# Patient Record
Sex: Female | Born: 1985 | Race: Black or African American | Hispanic: No | Marital: Single | Smoking: Never smoker
Health system: Southern US, Community
[De-identification: ages and names within clinical notes are randomized; demographics above are authoritative.]

## PROBLEM LIST (undated history)

## (undated) DIAGNOSIS — D649 Anemia, unspecified: Secondary | ICD-10-CM

## (undated) DIAGNOSIS — J45909 Unspecified asthma, uncomplicated: Secondary | ICD-10-CM

## (undated) DIAGNOSIS — M419 Scoliosis, unspecified: Secondary | ICD-10-CM

---

## 2004-12-09 ENCOUNTER — Encounter: Admission: RE | Admit: 2004-12-09 | Discharge: 2004-12-09 | Payer: Self-pay | Admitting: Family Medicine

## 2005-02-15 ENCOUNTER — Other Ambulatory Visit: Admission: RE | Admit: 2005-02-15 | Discharge: 2005-02-15 | Payer: Self-pay | Admitting: Family Medicine

## 2005-12-15 ENCOUNTER — Inpatient Hospital Stay (HOSPITAL_COMMUNITY): Admission: RE | Admit: 2005-12-15 | Discharge: 2005-12-18 | Payer: Self-pay | Admitting: Obstetrics

## 2007-04-23 ENCOUNTER — Emergency Department (HOSPITAL_COMMUNITY): Admission: EM | Admit: 2007-04-23 | Discharge: 2007-04-24 | Payer: Self-pay | Admitting: Emergency Medicine

## 2007-06-16 ENCOUNTER — Inpatient Hospital Stay (HOSPITAL_COMMUNITY): Admission: AD | Admit: 2007-06-16 | Discharge: 2007-06-17 | Payer: Self-pay | Admitting: Obstetrics

## 2007-06-17 ENCOUNTER — Encounter (INDEPENDENT_AMBULATORY_CARE_PROVIDER_SITE_OTHER): Payer: Self-pay | Admitting: Obstetrics

## 2008-03-18 ENCOUNTER — Emergency Department (HOSPITAL_COMMUNITY): Admission: EM | Admit: 2008-03-18 | Discharge: 2008-03-18 | Payer: Self-pay | Admitting: Emergency Medicine

## 2009-02-10 ENCOUNTER — Encounter: Admission: RE | Admit: 2009-02-10 | Discharge: 2009-04-06 | Payer: Self-pay | Admitting: Pediatric Orthopedic Surgery
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
## 2009-09-07 ENCOUNTER — Ambulatory Visit (HOSPITAL_COMMUNITY): Admission: RE | Admit: 2009-09-07 | Discharge: 2009-09-07 | Payer: Self-pay | Admitting: Obstetrics

## 2010-01-11 ENCOUNTER — Inpatient Hospital Stay (HOSPITAL_COMMUNITY): Admission: AD | Admit: 2010-01-11 | Discharge: 2010-01-11 | Payer: Self-pay | Admitting: Obstetrics & Gynecology

## 2010-01-18 ENCOUNTER — Inpatient Hospital Stay (HOSPITAL_COMMUNITY): Admission: AD | Admit: 2010-01-18 | Discharge: 2010-01-21 | Payer: Self-pay | Admitting: Obstetrics

## 2010-12-19 ENCOUNTER — Encounter: Payer: Self-pay | Admitting: Obstetrics

## 2011-02-16 LAB — CBC
HCT: 30.6 % — ABNORMAL LOW (ref 36.0–46.0)
Hemoglobin: 10.4 g/dL — ABNORMAL LOW (ref 12.0–15.0)
Hemoglobin: 9.6 g/dL — ABNORMAL LOW (ref 12.0–15.0)
MCHC: 34.3 g/dL (ref 30.0–36.0)
MCV: 94.3 fL (ref 78.0–100.0)
MCV: 94.6 fL (ref 78.0–100.0)
Platelets: 210 10*3/uL (ref 150–400)
RDW: 14.3 % (ref 11.5–15.5)
RDW: 14.3 % (ref 11.5–15.5)

## 2011-02-16 LAB — CCBB MATERNAL DONOR DRAW

## 2011-04-15 NOTE — Op Note (Signed)
[name], Melissa Delacruz             ACCOUNT NO.:  [id number]   MEDICAL RECORD NO.:  [id number]          PATIENT TYPE:  INP   LOCATION:                                FACILITY:  [facility]   PHYSICIAN:  Kathreen Cosier, M.D.DATE OF BIRTH:  May 08, 1986   DATE OF PROCEDURE:  12/15/2005  DATE OF DISCHARGE:  12/18/2005                                 OPERATIVE REPORT   PREOPERATIVE DIAGNOSES:  1.  Intrauterine pregnancy at term.  2.  The patient with severe scoliosis with contracted pelvis.  3.  Elective cesarean section at term.   SURGEON:  Kathreen Cosier, M.D.   ANESTHESIA:  Spinal.   DESCRIPTION OF PROCEDURE:  The patient placed on the operating table in the  supine position.  Abdomen prepped and draped.  Bladder emptied with Foley  catheter.  Transverse suprapubic incision made, carried down through the  rectus fascia.  The fascia was cleaned and incised the length of the  incision.  Rectus muscle were retracted laterally.  The peritoneum was  incised longitudinally.  Transverse incision made in the vesicouterine  peritoneum above the bladder.  The bladder mobilized inferiorly.  Transverse  lower uterine incision made. The patient was delivered from the OP position  of a female, Apgars 9 and 9 __________.  Fluid was clear.  The team was in  attendance.  Placenta was posterior. Fundal __________.  Uterine cavity was  cleaned with dry laps.  Uterine incision closed in one layer with continuous  suture of 0 chromic.  Fascia closed with continuous suture of 0 Dexon.  Skin  closed with subcuticular stitch of 4-0 Monocryl.  Blood loss 500 mL.  The  patient tolerated the procedure well and was taken to the recovery room in  good condition.           ______________________________  Kathreen Cosier, M.D.     BAM/MEDQ  D:  12/15/2005  T:  12/15/2005  Job:  [id number]

## 2011-04-15 NOTE — Discharge Summary (Signed)
NAMEBRIANNAH, LONA             ACCOUNT NO.:  [id number]   MEDICAL RECORD NO.:  [id number]            PATIENT TYPE:   LOCATION:                                 FACILITY:   PHYSICIAN:  Kathreen Cosier, M.D.DATE OF BIRTH:  November 15, 1986   DATE OF ADMISSION:  12/15/2005  DATE OF DISCHARGE:  12/18/2005                                 DISCHARGE SUMMARY   HISTORY:  The patient is a 25 year old gravida 1, EDC December 22, 2005.  The  patient has severe scoliosis and contracted pelvis.  She was brought in at  term for elective C-section.  She underwent a primary low-transverse  cesarean section and she had a female with Apgar of 9 and 9, weight 6 pounds  and 15 ounces from the OP position.   On admission her hemoglobin was 11.3, postoperative 8.6, platelets 227,  __________, PT/PTT are normal, sodium 135, potassium 3.9, chloride 104.  RPR  negative.  She was also A-positive.  The patient did well and was discharged  home on the third postoperative day, ambulatory, on a regular diet, to see  me in 6 weeks.   DISCHARGE DIAGNOSIS:  Status post elective cesarean section at term.           ______________________________  Kathreen Cosier, M.D.     BAM/MEDQ  D:  01/11/2006  T:  01/11/2006  Job:  [id number]

## 2011-09-12 LAB — CBC
Hemoglobin: 11.9 — ABNORMAL LOW
MCHC: 33.6
MCV: 92.3
RDW: 15.2 — ABNORMAL HIGH

## 2011-09-12 LAB — POCT PREGNANCY, URINE: Operator id: 127531

## 2012-06-16 ENCOUNTER — Encounter (HOSPITAL_COMMUNITY): Payer: Self-pay | Admitting: *Deleted

## 2012-06-16 ENCOUNTER — Emergency Department (HOSPITAL_COMMUNITY)
Admission: EM | Admit: 2012-06-16 | Discharge: 2012-06-16 | Disposition: A | Discharge status: Home / Self Care | Payer: Self-pay | Attending: Emergency Medicine | Admitting: Emergency Medicine

## 2012-06-16 DIAGNOSIS — IMO0002 Reserved for concepts with insufficient information to code with codable children: Secondary | ICD-10-CM | POA: Insufficient documentation

## 2012-06-16 DIAGNOSIS — J45909 Unspecified asthma, uncomplicated: Secondary | ICD-10-CM | POA: Insufficient documentation

## 2012-06-16 DIAGNOSIS — M4120 Other idiopathic scoliosis, site unspecified: Secondary | ICD-10-CM | POA: Insufficient documentation

## 2012-06-16 DIAGNOSIS — X500XXA Overexertion from strenuous movement or load, initial encounter: Secondary | ICD-10-CM | POA: Insufficient documentation

## 2012-06-16 DIAGNOSIS — S76219A Strain of adductor muscle, fascia and tendon of unspecified thigh, initial encounter: Secondary | ICD-10-CM

## 2012-06-16 HISTORY — DX: Unspecified asthma, uncomplicated: J45.909

## 2012-06-16 HISTORY — DX: Scoliosis, unspecified: M41.9

## 2012-06-16 MED ORDER — HYDROMORPHONE HCL PF 2 MG/ML IJ SOLN
2.0000 mg | Freq: Once | INTRAMUSCULAR | Status: AC
  Administered 2012-06-16: 2 mg via INTRAMUSCULAR
  Filled 2012-06-16: qty 1

## 2012-06-16 MED ORDER — HYDROCODONE-ACETAMINOPHEN 5-325 MG PO TABS: 1.0000 | ORAL_TABLET | Freq: Four times a day (QID) | ORAL | Status: AC | PRN

## 2012-06-16 MED ORDER — ONDANSETRON 4 MG PO TBDP
4.0000 mg | ORAL_TABLET | Freq: Once | ORAL | Status: AC
  Administered 2012-06-16: 16:00:00 via ORAL

## 2012-06-16 MED ORDER — ONDANSETRON 4 MG PO TBDP
ORAL_TABLET | ORAL | Status: AC
  Filled 2012-06-16: qty 1

## 2012-06-16 MED ORDER — NAPROXEN 500 MG PO TABS: 500.0000 mg | ORAL_TABLET | Freq: Two times a day (BID) | ORAL | Status: DC

## 2012-06-16 NOTE — ED Provider Notes (Signed)
History  Scribed for Shelda Jakes, MD, the patient was seen in room TR04C/TR04C. This chart was scribed by Candelaria Stagers. The patient's care started at 2:17 PM   CSN: [id number]  Arrival date & time 06/16/12  1247   First MD Initiated Contact with Patient 06/16/12 1407      Chief Complaint  Patient presents with  . Leg Pain    The history is provided by the patient.   Melissa Delacruz is a 26 y.o. female who presents to the Emergency Department complaining of anterior right leg pain mostly in her knee that started earlier today when she reports her right leg gave out while walking.  She is unable to ambulate.   Past Medical History  Diagnosis Date  . Scoliosis   . Asthma     History reviewed. No pertinent past surgical history.  History reviewed. No pertinent family history.  History  Substance Use Topics  . Smoking status: Not on file  . Smokeless tobacco: Not on file  . Alcohol Use: No    OB History    Grav Para Term Preterm Abortions TAB SAB Ect Mult Living                  Review of Systems  Musculoskeletal: Positive for arthralgias (anterior right leg pain).  All other systems reviewed and are negative.    Allergies  Latex  Home Medications   Current Outpatient Rx  Name Route Sig Dispense Refill  . HYDROCODONE-ACETAMINOPHEN 5-325 MG PO TABS Oral Take 1-2 tablets by mouth every 6 (six) hours as needed for pain. 10 tablet 0  . NAPROXEN 500 MG PO TABS Oral Take 1 tablet (500 mg total) by mouth 2 (two) times daily. 14 tablet 0    BP 114/82  Pulse 78  Temp 98 F (36.7 C) (Oral)  Resp 19  SpO2 100%  LMP 05/28/2012  Physical Exam  Nursing note and vitals reviewed. Constitutional: She is oriented to person, place, and time. She appears well-developed and well-nourished. No distress.  HENT:  Head: Normocephalic and atraumatic.  Cardiovascular: Normal rate and regular rhythm.   No murmur heard. Pulmonary/Chest: Effort normal. She has no  wheezes. She has no rales.  Abdominal: Soft. Bowel sounds are normal. There is no tenderness.  Musculoskeletal:       DP 2+.  Flexion normal.  Able to extend leg, quadriceps tendon intact.  No masses of right groin.  Normal sensation to right foot.    Neurological: She is alert and oriented to person, place, and time.  Skin: Skin is warm and dry. She is not diaphoretic.  Psychiatric: She has a normal mood and affect. Her behavior is normal.    ED Course  Procedures   DIAGNOSTIC STUDIES: Oxygen Saturation is 100% on room air, normal by my interpretation.    COORDINATION OF CARE:     Labs Reviewed - No data to display No results found.   1. Groin strain       MDM  Exam consistent with a right groin strain no evidence of quadricep muscle or tendon injury. Neurovascular patient very intact distally. Will treat with crutches anti-inflammatories pain medicine and if not improved in 2 weeks will have patient followup     I personally performed the services described in this documentation, which was scribed in my presence. The recorded information has been reviewed and considered.         Shelda Jakes, MD 06/16/12 1535

## 2012-06-16 NOTE — ED Notes (Signed)
Reports walking to store earlier and right leg "gave out on her." having pain to right thigh and knee, denies injury but reports unable to ambulate.

## 2013-05-10 ENCOUNTER — Encounter (HOSPITAL_COMMUNITY): Payer: Self-pay | Admitting: Emergency Medicine

## 2013-05-10 ENCOUNTER — Emergency Department (HOSPITAL_COMMUNITY)
Admission: EM | Admit: 2013-05-10 | Discharge: 2013-05-10 | Disposition: A | Discharge status: Home / Self Care | Payer: Medicaid Other | Attending: Emergency Medicine | Admitting: Emergency Medicine

## 2013-05-10 DIAGNOSIS — J45909 Unspecified asthma, uncomplicated: Secondary | ICD-10-CM | POA: Insufficient documentation

## 2013-05-10 DIAGNOSIS — K089 Disorder of teeth and supporting structures, unspecified: Secondary | ICD-10-CM | POA: Insufficient documentation

## 2013-05-10 DIAGNOSIS — Z792 Long term (current) use of antibiotics: Secondary | ICD-10-CM | POA: Insufficient documentation

## 2013-05-10 DIAGNOSIS — Z91040 Latex allergy status: Secondary | ICD-10-CM | POA: Insufficient documentation

## 2013-05-10 DIAGNOSIS — Z8739 Personal history of other diseases of the musculoskeletal system and connective tissue: Secondary | ICD-10-CM | POA: Insufficient documentation

## 2013-05-10 DIAGNOSIS — K0889 Other specified disorders of teeth and supporting structures: Secondary | ICD-10-CM

## 2013-05-10 MED ORDER — HYDROCODONE-ACETAMINOPHEN 5-325 MG PO TABS: 1.0000 | ORAL_TABLET | Freq: Four times a day (QID) | ORAL | Status: DC | PRN

## 2013-05-10 MED ORDER — AMOXICILLIN 500 MG PO CAPS: 500.0000 mg | ORAL_CAPSULE | Freq: Three times a day (TID) | ORAL | Status: DC

## 2013-05-10 NOTE — ED Provider Notes (Signed)
History     CSN: [id number]  Arrival date & time 05/10/13  3875   First MD Initiated Contact with Patient 05/10/13 0912      No chief complaint on file.   (Consider location/radiation/quality/duration/timing/severity/associated sxs/prior treatment) HPI Melissa Delacruz is a 27 y.o.female   presents to the emergency department with a dental complaint. Symptoms began two weeks ago and have been progressively getting worse. The patient has tried to alleviate pain with OTC pain medication.  Pain rated at a 10/10, characterized as throbbing in nature and located left lower molar. Patient denies fever, night sweats, chills, difficulty swallowing or opening mouth, SOB, nuchal rigidity or decreased ROM of neck.  She went to the dentist today to be seen for an appointment and they wanted $90 up front which she does not have so they would not see her at [facility] so she came here for tx.    Past Medical History  Diagnosis Date  . Scoliosis   . Asthma     No past surgical history on file.  No family history on file.  History  Substance Use Topics  . Smoking status: Not on file  . Smokeless tobacco: Not on file  . Alcohol Use: No    OB History   Grav Para Term Preterm Abortions TAB SAB Ect Mult Living                  Review of Systems  HENT: Positive for dental problem.   All other systems reviewed and are negative.    Allergies  Latex  Home Medications   Current Outpatient Rx  Name  Route  Sig  Dispense  Refill  . naproxen sodium (ANAPROX) 220 MG tablet   Oral   Take 220 mg by mouth 3 (three) times daily as needed (pain).         Marland Kitchen amoxicillin (AMOXIL) 500 MG capsule   Oral   Take 1 capsule (500 mg total) by mouth 3 (three) times daily.   21 capsule   0   . HYDROcodone-acetaminophen (NORCO/VICODIN) 5-325 MG per tablet   Oral   Take 1 tablet by mouth every 6 (six) hours as needed for pain.   15 tablet   0     BP 110/78  Pulse 69  Temp(Src)  97.3 F (36.3 C) (Oral)  Resp 18  SpO2 98%  Physical Exam  Constitutional: She appears well-developed and well-nourished. No distress.  HENT:  Head: Normocephalic and atraumatic.  Mouth/Throat: Uvula is midline, oropharynx is clear and moist and mucous membranes are normal. Normal dentition. Dental caries (Pts tooth shows no obvious abscess but moderate to severe tenderness to palpation of marked tooth) present. No edematous.    Eyes: Pupils are equal, round, and reactive to light.  Neck: Trachea normal, normal range of motion and full passive range of motion without pain. Neck supple.  Cardiovascular: Normal rate, regular rhythm, normal heart sounds and normal pulses.   Pulmonary/Chest: Effort normal and breath sounds normal. No respiratory distress. Chest wall is not dull to percussion. She exhibits no tenderness, no crepitus, no edema, no deformity and no retraction.  Abdominal: Normal appearance.  Musculoskeletal: Normal range of motion.  Neurological: She is alert. She has normal strength.  Skin: Skin is warm, dry and intact. She is not diaphoretic.  Psychiatric: She has a normal mood and affect. Her speech is normal. Cognition and memory are normal.    ED Course  Procedures (including critical care time)  Labs Reviewed - No data to display No results found.   1. Toothache       MDM  Patient has dental pain. No emergent s/sx's present. Patent airway. No trismus.  Will be given pain medication and antibiotics. I discussed the need to call dentist within 24/48 hours for follow-up. Dental referral given. Return to ED precautions given.  Pt voiced understanding and has agreed to follow-up.         Dorthula Matas, PA-C 05/10/13 [id number]

## 2013-05-10 NOTE — ED Notes (Signed)
Neva Seat PA-C in room at this time

## 2013-05-10 NOTE — ED Notes (Signed)
Pt with c/o left jaw/tooth pain. Pt reports started 2 weeks ago and has tried OTC pain meds with no relief. Minimal swelling to left mandible area. NAD noted. Speaking full sentences. Pt unable to go to 0800 dental appt this AM due to ins.

## 2013-05-10 NOTE — ED Notes (Signed)
NAD noted at time of d/c home. D/C inst reviewed with pt by PA-C.

## 2013-05-11 NOTE — ED Provider Notes (Signed)
Medical screening examination/treatment/procedure(s) were performed by non-physician practitioner and as supervising physician I was immediately available for consultation/collaboration.   Joya Gaskins, MD 05/11/13 0800

## 2015-04-17 ENCOUNTER — Encounter (HOSPITAL_COMMUNITY): Payer: Self-pay | Admitting: *Deleted

## 2015-04-17 ENCOUNTER — Emergency Department (HOSPITAL_COMMUNITY)
Admission: EM | Admit: 2015-04-17 | Discharge: 2015-04-17 | Disposition: A | Discharge status: Home / Self Care | Payer: Medicaid Other | Attending: Emergency Medicine | Admitting: Emergency Medicine

## 2015-04-17 DIAGNOSIS — Z91040 Latex allergy status: Secondary | ICD-10-CM | POA: Insufficient documentation

## 2015-04-17 DIAGNOSIS — J45909 Unspecified asthma, uncomplicated: Secondary | ICD-10-CM | POA: Insufficient documentation

## 2015-04-17 DIAGNOSIS — M419 Scoliosis, unspecified: Secondary | ICD-10-CM | POA: Insufficient documentation

## 2015-04-17 DIAGNOSIS — Z792 Long term (current) use of antibiotics: Secondary | ICD-10-CM | POA: Insufficient documentation

## 2015-04-17 DIAGNOSIS — M25551 Pain in right hip: Secondary | ICD-10-CM

## 2015-04-17 MED ORDER — NAPROXEN SODIUM 220 MG PO TABS: 220.0000 mg | ORAL_TABLET | Freq: Three times a day (TID) | ORAL | Status: DC | PRN

## 2015-04-17 MED ORDER — METHOCARBAMOL 500 MG PO TABS: 500.0000 mg | ORAL_TABLET | Freq: Two times a day (BID) | ORAL | Status: DC

## 2015-04-17 NOTE — ED Notes (Signed)
Pt sts she was seen last year for the same pain.  Sts it lasted a few days, denies any injury at that time, and sts she was seen, provided with pain medicine and crutches, and the pain resolved after a few days.  Pt sts this is the first time she has had the pain since last year.  Sts she woke up to it yesterday.

## 2015-04-17 NOTE — Discharge Instructions (Signed)
Iliac Apophysitis with Rehab Iliac apophysitis is a condition that causes pain and inflammation over bony prominence of the pelvis (iliac crest). The iliac crest is the attachment site for the abdominal muscles. Repetitive strenuous exercises often cause weakness in the growth plate of the ileum (one of the bones of the pelvis), near where these muscles attach. Iliac apophysitis is a temporary condition in which the growth plate becomes inflamed. Occasionally the stress placed on the iliac crest will cause a bone to pull away from the growth plate (avulsion fracture). This injury is uncommon after the body becomes skeletally mature (approximately 14 years in girls and 16 years in boys).  SYMPTOMS   A slightly swollen, warm, and tender area along the bony bump on the side at the pelvis, above the hip (iliac crest).  Pain with activity, especially running, jumping, kicking, and twisting (such as batting or pitching in baseball).  Symptoms that slowly worsen. CAUSES  Iliac apophysitis is caused by repetitive stress placed on the iliac crest and the associated growth plate.  RISK INCREASES WITH:   Strenuous conditioning routines, especially if they involve a rapid increase in intensity, duration, or frequency.  Sports that require body twisting (golf, batting or pitching in baseball).  Sports that require sprinting and kicking (soccer or kicking in football).  Growth spurts.  Poor strength and flexibility. PREVENTION  Warm up and stretch appropriately before activity.  Maintain physical fitness:  Strength, flexibility, and endurance.  Cardiovascular fitness.  Exercise moderately, avoiding extremes.  Learn and use proper technique, especially avoiding cross-body arm motion with running.  Avoid rapid or extreme change in training or activity. PROGNOSIS  The prognosis for iliac apophysitis depends on the severity of injury. Mild cases can be expected to resolve with only a slight  reduction in training intensity, duration, and frequency. Moderate and severe cases may require significantly reduced activity for 4 to 6 weeks.  RELATED COMPLICATIONS  Bone infection.  Recurrent symptoms, especially if activity is resumed too soon.  Prolonged healing time if usual activities are resumed too soon.  Avulsion of the iliac crest (bone pulls off and away from the pelvis). TREATMENT Treatment initially involves ice and medication to help reduce pain and inflammation. Strengthening and stretching exercises of the abdominal muscles may help reduce pain, as well as modifying activities so they do not include any motions that cause increased pain. For moderate or severe cases it is important to rest and allow the body to heal. It is recommended that you have a gradual return to sport after recovery. For cases of iliac apophysitis that become chronic, one may be referred to a therapist for further evaluation and treatment. MEDICATION  If pain medication is necessary, nonsteroidal anti-inflammatory medications, such as aspirin and ibuprofen, or other minor pain relievers, such as acetaminophen, are often recommended.  Do not take pain medication within 7 days before surgery.  Prescription pain relievers may be given if deemed necessary by your caregiver. Use only as directed and only as much as you need.  Ointments applied to the skin may be helpful. HEAT AND COLD  Cold treatment (icing) relieves pain and reduces inflammation. Cold treatment should be applied for 10 to 15 minutes every 2 to 3 hours for inflammation and pain and immediately after any activity that aggravates your symptoms. Use ice packs or an ice massage.  Heat treatment may be used prior to performing the stretching and strengthening activities prescribed by your caregiver, physical therapist, or athletic trainer. Use a  heat pack or a warm soak. SEEK MEDICAL CARE IF:   Symptoms get worse or do not improve in 4  weeks, despite treatment.  You have a fever greater than 102 F (38.9 C).  New, unexplained symptoms develop (drugs used in treatment may produce side effects). EXERCISES  RANGE OF MOTION (ROM) AND STRETCHING EXERCISES - Apophysitis, Iliac These exercises may help you when beginning to rehabilitate your injury. Your symptoms may resolve with or without further involvement from your physician, physical therapist or athletic trainer. While completing these exercises remember:   Restoring tissue flexibility helps normal motion to return to the joints. This allows healthier, less painful movement and activity.  An effective stretch should be held for at least 30 seconds.  A stretch should never be painful. You should only feel a gentle lengthening or release in the stretched tissue. STRETCH - Quadriceps, Prone  Lie on your stomach on a firm surface, such as a bed or padded floor.  Bend your right / left knee and grasp your ankle. If you are unable to reach, your ankle or pant leg, use a belt around your foot to lengthen your reach.  Gently pull your heel toward your buttocks. Your knee should not slide out to the side. You should feel a stretch in the front of your thigh and/or knee.  Hold this position for __________ seconds.  Repeat __________ times. Complete this stretch __________ times per day. STRETCH - Extension, Prone on Elbows  Lie on your stomach on the floor, a bed will be too soft. Place your palms about shoulder width apart and at the height of your head.  Place your elbows under your shoulders. If this is too painful, stack pillows under your chest.  Allow your body to relax so that your hips drop lower and make contact more completely with the floor.  Hold this position for __________ seconds.  Slowly return to lying flat on the floor. Repeat __________ times. Complete this exercise __________ times per day.  RANGE OF MOTION - Extension, Prone Press Ups   Lie on  your stomach on the floor, a bed will be too soft. Place your palms about shoulder width apart and at the height of your head.  Keeping your back as relaxed as possible, slowly straighten your elbows while keeping your hips on the floor. You may adjust the placement of your hands to maximize your comfort. As you gain motion, your hands will come more underneath your shoulders.  Hold this position __________ seconds.  Slowly return to lying flat on the floor. Repeat __________ times. Complete this exercise __________ times per day.  STRETCH - Low Trunk Rotation   Lie on a firm bed or floor. Keeping your legs in front of you, bend your knees so they are both pointed toward the ceiling and your feet are flat on the floor.  Extend your arms out to the side. This will stabilize your upper body by keeping your shoulders in contact with the floor.  Gently and slowly drop both knees together to one side until you feel a gentle stretch in your lower back. Hold for __________ seconds.  Tense your stomach muscles to support your lower back as you bring your knees back to the starting position. Repeat the exercise to the other side. Repeat __________ times. Complete this exercise __________ times per day  STRENGTHENING EXERCISES - Apophysitis, Iliac These exercises may help you when beginning to rehabilitate your injury. They may resolve your symptoms with  or without further involvement from your physician, physical therapist or athletic trainer. While completing these exercises remember:   Muscles can gain both the endurance and the strength needed for everyday activities through controlled exercises.  Complete these exercises as instructed by your physician, physical therapist or athletic trainer. Progress the resistance and repetitions only as guided.  You may experience muscle soreness or fatigue, but the pain or discomfort you are trying to eliminate should never worsen during these exercises. If  this pain does worsen, stop and make certain you are following the directions exactly. If the pain is still present after adjustments, discontinue the exercise until you can discuss the trouble with your clinician. STRENGTHENING - Deep Abdominals, Pelvic Tilt   Lie on a firm bed or floor. Keeping your legs in front of you, bend your knees so they are both pointed toward the ceiling and your feet are flat on the floor.  Tense your lower abdominal muscles to press your low back into the floor. This motion will rotate your pelvis so that your tail bone is scooping upwards rather than pointing at your feet or into the floor.  With a gentle tension and even breathing, hold this position for __________ seconds. Repeat __________ times. Complete this exercise __________ times per day.  STRENGTHENING - Abdominals, Crunches   Lie on a firm bed or floor. Keeping your legs in front of you, bend your knees so they are both pointed toward the ceiling and your feet are flat on the floor. Cross your arms over your chest.  Slightly tip your chin down without bending your neck.  Tense your abdominals and slowly lift your trunk high enough to just clear your shoulder blades. Lifting higher can put excessive stress on the low back and does not further strengthen your abdominal muscles.  Control your return to the starting position. Repeat __________ times. Complete this exercise __________ times per day.  STRENGTHENING - Lower Abdominals, Double Knee Lift  Lie on a firm bed or floor. Keeping your legs in front of you, bend your knees so they are both pointed toward the ceiling and your feet are flat on the floor.  Tense your abdominal muscles to brace your lower back and slowly lift both of your knees until they come over your hips. Be certain not to hold your breath.  Hold __________ seconds. Using your abdominal muscles, return to the starting position in a slow and controlled manner. Repeat __________  times. Complete this exercise __________ times per day.  Document Released: 06/15/2005 Document Revised: 02/06/2012 Document Reviewed: 02/26/2009 [facility] Patient Information 2015 [employer] [place]. This information is not intended to replace advice given to you by your health care provider. Make sure you discuss any questions you have with your health care provider.

## 2015-04-17 NOTE — ED Notes (Signed)
Pt reports right hip pain and leg pain, hx of same but denies any injury. Pain increases when ambulating. No acute distress noted at triage.

## 2015-04-17 NOTE — ED Provider Notes (Signed)
CSN: [id number]     Arrival date & time 04/17/15  1655 History  This chart was scribed for non-physician practitioner, Fayrene HelperBowie Kahli Mayon, PA-C,working with Purvis SheffieldForrest Harrison, MD, by Karle PlumberJennifer Tensley, ED Scribe. This patient was seen in room TR05C/TR05C and the patient's care was started at 5:26 PM.  Chief Complaint  Patient presents with  . Hip Pain  . Leg Pain   The history is provided by the patient and medical records. No language interpreter was used.    HPI Comments:  Melissa Delacruz is a 29 y.o. female who presents to the Emergency Department complaining of moderate, sharp, shooting right hip pain that began yesterday. She states the pain radiates into her right thigh. She states she had similar symptoms last year and was prescribed pain medication and crutches and reports resolution of the symptoms within a few days. Pt has taken two Aleve with no significant relief of the pain. Bearing weight and walking makes the pain worse. Sitting and resting help ease the pain. Denies any trauma, injury or fall. Denies fever, chills, abdominal pain, back pain, dysuria, hematuria, numbness, tingling or weakness of the RLE, rash, bowel or bladder incontinence.  Past Medical History  Diagnosis Date  . Scoliosis   . Asthma    History reviewed. No pertinent past surgical history. History reviewed. No pertinent family history. History  Substance Use Topics  . Smoking status: Not on file  . Smokeless tobacco: Not on file  . Alcohol Use: No   OB History    No data available     Review of Systems  Constitutional: Negative for fever and chills.  Gastrointestinal: Negative for abdominal pain.  Genitourinary: Negative for dysuria and hematuria.       No bowel or bladder incontinence  Musculoskeletal: Positive for arthralgias.  Skin: Negative for color change, rash and wound.  Neurological: Negative for weakness and numbness.    Allergies  Latex  Home Medications   Prior to Admission medications    Medication Sig Start Date End Date Taking? Authorizing Provider  amoxicillin (AMOXIL) 500 MG capsule Take 1 capsule (500 mg total) by mouth 3 (three) times daily. 05/10/13   Marlon Peliffany Greene, PA-C  HYDROcodone-acetaminophen (NORCO/VICODIN) 5-325 MG per tablet Take 1 tablet by mouth every 6 (six) hours as needed for pain. 05/10/13   Tiffany Neva SeatGreene, PA-C  naproxen sodium (ANAPROX) 220 MG tablet Take 220 mg by mouth 3 (three) times daily as needed (pain).    Historical Provider, MD   Triage Vitals: BP 112/71 mmHg  Pulse 85  Temp(Src) 98.4 F (36.9 C) (Oral)  Resp 20  SpO2 99%  LMP 04/10/2015 Physical Exam  Constitutional: She is oriented to person, place, and time. She appears well-developed and well-nourished.  HENT:  Head: Normocephalic and atraumatic.  Eyes: EOM are normal.  Neck: Normal range of motion.  Cardiovascular: Normal rate.   Pedal pulse intact.  Pulmonary/Chest: Effort normal.  Musculoskeletal: Normal range of motion. She exhibits tenderness.  Scoliosis with no significant midline tenderness. Tenderness to palpation to lateral right hip. No gross deformity. Tenderness to palpation to anterior thigh. No swelling. No bruising.  Neurological: She is alert and oriented to person, place, and time.  Patellar DT reflexes intact. Equal strength to BLE. Normal skin tone. Able to ambulate with a limp.  Skin: Skin is warm and dry. No rash noted.  Psychiatric: She has a normal mood and affect. Her behavior is normal.  Nursing note and vitals reviewed.   ED Course  Procedures (including critical care time) DIAGNOSTIC STUDIES: Oxygen Saturation is 99% on RA, normal by my interpretation.   COORDINATION OF CARE: 5:35 PM- Will prescribe NSAID and give orthopedic referral for continued or worsening symptoms. She states she has crutches at home in which I encouraged her to use. Pt verbalizes understanding and agrees to plan.  Medications - No data to display  Labs Review Labs Reviewed  - No data to display  Imaging Review No results found.   EKG Interpretation None      MDM   Final diagnoses:  Right hip pain    BP 112/71 mmHg  Pulse 85  Temp(Src) 98.4 F (36.9 C) (Oral)  Resp 20  SpO2 99%  LMP 04/10/2015   I personally performed the services described in this documentation, which was scribed in my presence. The recorded information has been reviewed and is accurate.    Fayrene HelperBowie Kimari Coudriet, PA-C 04/17/15 1755  Purvis SheffieldForrest Harrison, MD 04/17/15 [phone]

## 2019-07-11 ENCOUNTER — Other Ambulatory Visit: Payer: Self-pay | Admitting: Internal Medicine

## 2019-07-11 DIAGNOSIS — Z20822 Contact with and (suspected) exposure to covid-19: Secondary | ICD-10-CM

## 2019-07-13 LAB — NOVEL CORONAVIRUS, NAA: SARS-CoV-2, NAA: NOT DETECTED

## 2021-03-28 DIAGNOSIS — Z419 Encounter for procedure for purposes other than remedying health state, unspecified: Secondary | ICD-10-CM | POA: Diagnosis not present

## 2021-04-28 DIAGNOSIS — Z419 Encounter for procedure for purposes other than remedying health state, unspecified: Secondary | ICD-10-CM | POA: Diagnosis not present

## 2021-05-28 DIAGNOSIS — Z419 Encounter for procedure for purposes other than remedying health state, unspecified: Secondary | ICD-10-CM | POA: Diagnosis not present

## 2023-05-12 LAB — RPR: RPR: NONREACTIVE

## 2023-05-12 LAB — RUBELLA IGG AB(REFL): RUBELLA AB (IGG) (REFL): IMMUNE

## 2023-06-09 DIAGNOSIS — O09521 Supervision of elderly multigravida, first trimester: Secondary | ICD-10-CM | POA: Insufficient documentation

## 2023-06-09 DIAGNOSIS — Z8739 Personal history of other diseases of the musculoskeletal system and connective tissue: Secondary | ICD-10-CM | POA: Insufficient documentation

## 2023-06-09 DIAGNOSIS — Z98891 History of uterine scar from previous surgery: Secondary | ICD-10-CM | POA: Insufficient documentation

## 2023-06-09 DIAGNOSIS — Z8709 Personal history of other diseases of the respiratory system: Secondary | ICD-10-CM | POA: Insufficient documentation

## 2023-09-26 LAB — HEPATITIS B SURFACE ANTIGEN: Hep B Surface Antigen Quant: NEGATIVE

## 2023-11-06 ENCOUNTER — Encounter: Payer: Self-pay | Admitting: Obstetrics & Gynecology

## 2023-11-06 ENCOUNTER — Other Ambulatory Visit (HOSPITAL_COMMUNITY)
Admission: RE | Admit: 2023-11-06 | Discharge: 2023-11-06 | Disposition: A | Discharge status: Home / Self Care | Payer: No Typology Code available for payment source | Source: Ambulatory Visit | Attending: Obstetrics & Gynecology | Admitting: Obstetrics & Gynecology

## 2023-11-06 ENCOUNTER — Telehealth: Payer: Self-pay | Admitting: Pharmacy Technician

## 2023-11-06 ENCOUNTER — Other Ambulatory Visit: Payer: Self-pay

## 2023-11-06 ENCOUNTER — Ambulatory Visit (INDEPENDENT_AMBULATORY_CARE_PROVIDER_SITE_OTHER): Payer: Self-pay | Admitting: Obstetrics & Gynecology

## 2023-11-06 VITALS — BP 106/70 | HR 88 | Wt 128.0 lb

## 2023-11-06 DIAGNOSIS — Z98891 History of uterine scar from previous surgery: Secondary | ICD-10-CM | POA: Diagnosis not present

## 2023-11-06 DIAGNOSIS — B9689 Other specified bacterial agents as the cause of diseases classified elsewhere: Secondary | ICD-10-CM | POA: Insufficient documentation

## 2023-11-06 DIAGNOSIS — O26893 Other specified pregnancy related conditions, third trimester: Secondary | ICD-10-CM | POA: Diagnosis present

## 2023-11-06 DIAGNOSIS — O99019 Anemia complicating pregnancy, unspecified trimester: Secondary | ICD-10-CM

## 2023-11-06 DIAGNOSIS — O99810 Abnormal glucose complicating pregnancy: Secondary | ICD-10-CM | POA: Diagnosis not present

## 2023-11-06 DIAGNOSIS — O0993 Supervision of high risk pregnancy, unspecified, third trimester: Secondary | ICD-10-CM | POA: Insufficient documentation

## 2023-11-06 DIAGNOSIS — O23593 Infection of other part of genital tract in pregnancy, third trimester: Secondary | ICD-10-CM | POA: Diagnosis not present

## 2023-11-06 DIAGNOSIS — O09521 Supervision of elderly multigravida, first trimester: Secondary | ICD-10-CM

## 2023-11-06 DIAGNOSIS — Z3A36 36 weeks gestation of pregnancy: Secondary | ICD-10-CM | POA: Diagnosis present

## 2023-11-06 DIAGNOSIS — D509 Iron deficiency anemia, unspecified: Secondary | ICD-10-CM | POA: Insufficient documentation

## 2023-11-06 DIAGNOSIS — O98819 Other maternal infectious and parasitic diseases complicating pregnancy, unspecified trimester: Secondary | ICD-10-CM | POA: Diagnosis not present

## 2023-11-06 DIAGNOSIS — N898 Other specified noninflammatory disorders of vagina: Secondary | ICD-10-CM | POA: Diagnosis present

## 2023-11-06 DIAGNOSIS — A749 Chlamydial infection, unspecified: Secondary | ICD-10-CM | POA: Insufficient documentation

## 2023-11-06 DIAGNOSIS — N760 Acute vaginitis: Secondary | ICD-10-CM

## 2023-11-06 NOTE — Telephone Encounter (Signed)
Patient will be scheduled as soon as possible.  Auth Submission: NO AUTH NEEDED Site of care: Site of care: [facility] Payer: [employer] Medication & CPT/J Code(s) submitted: Venofer (Iron Sucrose) J1756 Route of submission (phone, fax, portal):  Phone # Fax # Auth type: Buy/Bill PB Units/visits requested: X2 DOSES Reference number:  Approval from: 11/06/23 to 12/29/23

## 2023-11-06 NOTE — Addendum Note (Signed)
Addended by: Ihor Austin D on: 11/06/2023 03:38 PM   Modules accepted: Orders

## 2023-11-06 NOTE — Progress Notes (Signed)
   PRENATAL VISIT NOTE  Subjective:  Melissa Delacruz is a 37 y.o. G3P2002 at [redacted]w[redacted]d being seen today for transfer of ongoing prenatal care from [place].  She is currently monitored for the following issues for this high-risk pregnancy and has History of 2 cesarean sections; AMA (advanced maternal age) multigravida 35+, first trimester; Iron deficiency anemia during pregnancy; Abnormal glucose tolerance test (GTT) during pregnancy, antepartum; Supervision of high risk pregnancy in third trimester; and Chlamydia infection affecting pregnancy on their problem list.  Patient reports no complaints.  Contractions: Irritability. Vag. Bleeding: None.  Movement: Present. Denies leaking of fluid.   The following portions of the patient's history were reviewed and updated as appropriate: allergies, current medications, past family history, past medical history, past social history, past surgical history and problem list.   Objective:   Vitals:   11/06/23 0956  BP: 106/70  Pulse: 88  Weight: 128 lb (58.1 kg)    Fetal Status: Fetal Heart Rate (bpm): 125 Fundal Height: 38 cm Movement: Present     General:  Alert, oriented and cooperative. Patient is in no acute distress.  Skin: Skin is warm and dry. No rash noted.   Cardiovascular: Normal heart rate noted  Respiratory: Normal respiratory effort, no problems with respiration noted  Abdomen: Soft, gravid, appropriate for gestational age.  Pain/Pressure: Present     Pelvic: Cervical exam performed in the presence of a chaperone Dilation: Closed Effacement (%): Thick Station: Ballotable.  Thick, white discharge noted, pelvic cultures obtained.  Extremities: Normal range of motion.  Edema: None  Mental Status: Normal mood and affect. Normal behavior. Normal judgment and thought content.   Assessment and Plan:  Pregnancy: G3P2002 at [redacted]w[redacted]d 1. Abnormal glucose tolerance test (GTT) during pregnancy, antepartum 1 hr GTT 155. Unable to get confirmatory GTT  soon. Will check labs today, check random/fasting CBGs at next few visit.  - Hemoglobin A1c - [place] MFM OB DETAIL +14 WK; Future  2. History of 2 cesarean sections Surgical request sent to schedule third cesarean section at 39 weeks. - [place] MFM OB DETAIL +14 WK; Future - Ambulatory Referral For Surgery Scheduling  3. Chlamydia infection affecting pregnancy, antepartum Positive on 09/29/23, took Azithryomycin.  TOC done yet. - Cervicovaginal ancillary only  4. Vaginal discharge during pregnancy in third trimester - Cervicovaginal ancillary only done, will follow up results and manage accordingly.   5. AMA (advanced maternal age) multigravida 35+, first trimester LR NIPS . - [place] MFM OB DETAIL +14 WK; Future  6. Iron deficiency anemia during pregnancy IV iron ordered via [facility]. Last hemoglobin 9.4. - Anemia Profile B  7. [redacted] weeks gestation of pregnancy 8. Supervision of high risk pregnancy in third trimester Pelvic cultures done, will follow up results and manage accordingly. MFM scan done. - Culture, beta strep (group b only) - [place] MFM OB DETAIL +14 WK; Future  Labor symptoms and general obstetric precautions including but not limited to vaginal bleeding, contractions, leaking of fluid and fetal movement were reviewed in detail with the patient. Please refer to After Visit Summary for other counseling recommendations.   Return in about 1 week (around 11/13/2023) for OFFICE OB VISIT (MD or APP).  Future Appointments  Date Time Provider Department Center  11/13/2023  8:15 AM Federico Flake, MD [facility]    Jaynie Collins, MD

## 2023-11-07 LAB — ANEMIA PROFILE B
Basophils Absolute: 0 10*3/uL (ref 0.0–0.2)
Basos: 1 %
EOS (ABSOLUTE): 0.1 10*3/uL (ref 0.0–0.4)
Eos: 2 %
Ferritin: 9 ng/mL — ABNORMAL LOW (ref 15–150)
Folate: 6 ng/mL (ref >3.0)
Hematocrit: 27.6 % — ABNORMAL LOW (ref 34.0–46.6)
Hemoglobin: 8.9 g/dL — ABNORMAL LOW (ref 11.1–15.9)
Immature Grans (Abs): 0.1 10*3/uL (ref 0.0–0.1)
Immature Granulocytes: 1 %
Iron Saturation: 6 % — CL (ref 15–55)
Iron: 27 ug/dL (ref 27–159)
Lymphocytes Absolute: 0.9 10*3/uL (ref 0.7–3.1)
Lymphs: 16 %
MCH: 24.9 pg — ABNORMAL LOW (ref 26.6–33.0)
MCHC: 32.2 g/dL (ref 31.5–35.7)
MCV: 77 fL — ABNORMAL LOW (ref 79–97)
Monocytes Absolute: 0.4 10*3/uL (ref 0.1–0.9)
Monocytes: 8 %
Neutrophils Absolute: 3.9 10*3/uL (ref 1.4–7.0)
Neutrophils: 72 %
Platelets: 190 10*3/uL (ref 150–450)
RBC: 3.57 x10E6/uL — ABNORMAL LOW (ref 3.77–5.28)
RDW: 17.5 % — ABNORMAL HIGH (ref 11.7–15.4)
Retic Ct Pct: 1.9 % (ref 0.6–2.6)
Total Iron Binding Capacity: 481 ug/dL — ABNORMAL HIGH (ref 250–450)
UIBC: 454 ug/dL — ABNORMAL HIGH (ref 131–425)
Vitamin B-12: 139 pg/mL — ABNORMAL LOW (ref 232–1245)
WBC: 5.4 10*3/uL (ref 3.4–10.8)

## 2023-11-07 LAB — CERVICOVAGINAL ANCILLARY ONLY
Bacterial Vaginitis (gardnerella): POSITIVE — AB
Candida Glabrata: NEGATIVE
Candida Vaginitis: NEGATIVE
Chlamydia: NEGATIVE
Comment: NEGATIVE
Comment: NEGATIVE
Comment: NEGATIVE
Comment: NEGATIVE
Comment: NEGATIVE
Comment: NORMAL
Neisseria Gonorrhea: NEGATIVE
Trichomonas: NEGATIVE

## 2023-11-07 LAB — HEMOGLOBIN A1C
Est. average glucose Bld gHb Est-mCnc: 117 mg/dL
Hgb A1c MFr Bld: 5.7 % — ABNORMAL HIGH (ref 4.8–5.6)

## 2023-11-07 MED ORDER — METRONIDAZOLE 500 MG PO TABS: 500.0000 mg | ORAL_TABLET | Freq: Two times a day (BID) | ORAL | 0 refills | Status: AC

## 2023-11-07 NOTE — Addendum Note (Signed)
Addended by: Jaynie Collins A on: 11/07/2023 08:15 PM   Modules accepted: Orders

## 2023-11-08 ENCOUNTER — Encounter: Payer: Self-pay | Admitting: *Deleted

## 2023-11-09 LAB — CULTURE, BETA STREP (GROUP B ONLY): Strep Gp B Culture: NEGATIVE

## 2023-11-10 NOTE — Patient Instructions (Signed)
Melissa Delacruz  11/10/2023   Your procedure is scheduled on:  11/26/2023  Arrive at 0730 at Entrance C on [address] at [facility]  and [employer]. You are invited to use the FREE valet parking or use the Visitor's parking deck.  Pick up the phone at the desk and dial [phone].  Call this number if you have problems the morning of surgery: [phone]  Remember:   Do not eat food:(After Midnight) Desps de medianoche.  You may drink clear liquids until arrival at ___0730__.  Clear liquids means a liquid you can see thru.  It can have color such as Cola or Kool aid.  Tea is OK and coffee as long as no milk or creamer of any kind.  Take these medicines the morning of surgery with A SIP OF WATER:  none   Do not wear jewelry, make-up or nail polish.  Do not wear lotions, powders, or perfumes. Do not wear deodorant.  Do not shave 48 hours prior to surgery.  Do not bring valuables to the hospital.  [facility] is not   responsible for any belongings or valuables brought to the hospital.  Contacts, dentures or bridgework may not be worn into surgery.  Leave suitcase in the car. After surgery it may be brought to your room.  For patients admitted to the hospital, checkout time is 11:00 AM the day of              discharge.      Please read over the following fact sheets that you were given:     Preparing for Surgery

## 2023-11-13 ENCOUNTER — Other Ambulatory Visit: Payer: Self-pay

## 2023-11-13 ENCOUNTER — Ambulatory Visit (INDEPENDENT_AMBULATORY_CARE_PROVIDER_SITE_OTHER): Payer: No Typology Code available for payment source | Admitting: Family Medicine

## 2023-11-13 VITALS — BP 110/73 | HR 92 | Wt 131.0 lb

## 2023-11-13 DIAGNOSIS — O99810 Abnormal glucose complicating pregnancy: Secondary | ICD-10-CM

## 2023-11-13 DIAGNOSIS — O0993 Supervision of high risk pregnancy, unspecified, third trimester: Secondary | ICD-10-CM

## 2023-11-13 DIAGNOSIS — O09523 Supervision of elderly multigravida, third trimester: Secondary | ICD-10-CM | POA: Diagnosis not present

## 2023-11-13 DIAGNOSIS — A749 Chlamydial infection, unspecified: Secondary | ICD-10-CM

## 2023-11-13 DIAGNOSIS — D509 Iron deficiency anemia, unspecified: Secondary | ICD-10-CM

## 2023-11-13 DIAGNOSIS — O09521 Supervision of elderly multigravida, first trimester: Secondary | ICD-10-CM

## 2023-11-13 DIAGNOSIS — O98813 Other maternal infectious and parasitic diseases complicating pregnancy, third trimester: Secondary | ICD-10-CM | POA: Diagnosis not present

## 2023-11-13 DIAGNOSIS — O99013 Anemia complicating pregnancy, third trimester: Secondary | ICD-10-CM

## 2023-11-13 DIAGNOSIS — O34219 Maternal care for unspecified type scar from previous cesarean delivery: Secondary | ICD-10-CM | POA: Diagnosis not present

## 2023-11-13 DIAGNOSIS — Z98891 History of uterine scar from previous surgery: Secondary | ICD-10-CM

## 2023-11-13 DIAGNOSIS — Z3A37 37 weeks gestation of pregnancy: Secondary | ICD-10-CM

## 2023-11-13 NOTE — Addendum Note (Signed)
Addended by: Geanie Berlin on: 11/13/2023 10:28 AM   Modules accepted: Orders

## 2023-11-13 NOTE — Progress Notes (Signed)
   PRENATAL VISIT NOTE  Subjective:  Melissa Delacruz is a 37 y.o. G3P2002 at [redacted]w[redacted]d being seen today for ongoing prenatal care.  She is currently monitored for the following issues for this high-risk pregnancy and has History of 2 cesarean sections; AMA (advanced maternal age) multigravida 35+, first trimester; Iron deficiency anemia during pregnancy; Abnormal glucose tolerance test (GTT) during pregnancy, antepartum; Supervision of high risk pregnancy in third trimester; and Chlamydia infection affecting pregnancy on their problem list.  Patient reports no complaints.  Contractions: Irritability. Vag. Bleeding: None.  Movement: Present. Denies leaking of fluid.   The following portions of the patient's history were reviewed and updated as appropriate: allergies, current medications, past family history, past medical history, past social history, past surgical history and problem list.   Objective:   Vitals:   11/13/23 0835  BP: 110/73  Pulse: 92  Weight: 131 lb (59.4 kg)    Fetal Status: Fetal Heart Rate (bpm): 138 Fundal Height: 38 cm Movement: Present  Presentation: Vertex  General:  Alert, oriented and cooperative. Patient is in no acute distress.  Skin: Skin is warm and dry. No rash noted.   Cardiovascular: Normal heart rate noted  Respiratory: Normal respiratory effort, no problems with respiration noted  Abdomen: Soft, gravid, appropriate for gestational age.  Pain/Pressure: Absent     Pelvic: Cervical exam deferred        Extremities: Normal range of motion.  Edema: None  Mental Status: Normal mood and affect. Normal behavior. Normal judgment and thought content.   Assessment and Plan:  Pregnancy: G3P2002 at [redacted]w[redacted]d 1. Supervision of high risk pregnancy in third trimester (Primary) Up to date Transfer from [facility] at 36 weeks Patient desires BTS and does not yet have medicaid. Reviewed I will request this with her CS and the team will consent her that day. Reviewed permanent  nature of the sterilization. She does not desire future pregnancy FH is appropriate  2. History of 2 cesarean sections RCS 12/29  3. Abnormal glucose tolerance test (GTT) during pregnancy, antepartum HA1C 5.7% Needs 2 hr GTT-- schedule ASAP.   4. AMA (advanced maternal age) multigravida 35+, first trimester  5. Chlamydia infection affecting pregnancy in second trimester Negative at 36 weeks  6. Iron deficiency anemia during pregnancy HGB 8.9 Placed on Ferrous sulfate   Preterm labor symptoms and general obstetric precautions including but not limited to vaginal bleeding, contractions, leaking of fluid and fetal movement were reviewed in detail with the patient. Please refer to After Visit Summary for other counseling recommendations.   Return in about 1 week (around 11/20/2023) for Routine prenatal care.  Future Appointments  Date Time Provider Department Center  11/23/2023  8:20 AM [facility]  11/23/2023  8:55 AM Reva Bores, MD [facility]  11/24/2023 12:00 PM [facility] PAT 1 [facility] None    Federico Flake, MD

## 2023-11-14 ENCOUNTER — Encounter (HOSPITAL_COMMUNITY): Payer: Self-pay

## 2023-11-23 ENCOUNTER — Inpatient Hospital Stay (HOSPITAL_COMMUNITY): Payer: No Typology Code available for payment source | Admitting: Anesthesiology

## 2023-11-23 ENCOUNTER — Inpatient Hospital Stay (HOSPITAL_COMMUNITY)
Admission: AD | Admit: 2023-11-23 | Discharge: 2023-11-26 | DRG: 784 | Disposition: A | Discharge status: Home / Self Care | Payer: No Typology Code available for payment source | Attending: Family Medicine | Admitting: Family Medicine

## 2023-11-23 ENCOUNTER — Encounter: Payer: No Typology Code available for payment source | Admitting: Family Medicine

## 2023-11-23 ENCOUNTER — Other Ambulatory Visit: Payer: Self-pay

## 2023-11-23 ENCOUNTER — Encounter (HOSPITAL_COMMUNITY): Payer: Self-pay | Admitting: Obstetrics & Gynecology

## 2023-11-23 ENCOUNTER — Other Ambulatory Visit: Payer: No Typology Code available for payment source

## 2023-11-23 DIAGNOSIS — Z3A39 39 weeks gestation of pregnancy: Secondary | ICD-10-CM | POA: Diagnosis not present

## 2023-11-23 DIAGNOSIS — O9081 Anemia of the puerperium: Secondary | ICD-10-CM | POA: Diagnosis not present

## 2023-11-23 DIAGNOSIS — D62 Acute posthemorrhagic anemia: Secondary | ICD-10-CM | POA: Diagnosis not present

## 2023-11-23 DIAGNOSIS — O26893 Other specified pregnancy related conditions, third trimester: Secondary | ICD-10-CM | POA: Diagnosis present

## 2023-11-23 DIAGNOSIS — Z8249 Family history of ischemic heart disease and other diseases of the circulatory system: Secondary | ICD-10-CM | POA: Diagnosis not present

## 2023-11-23 DIAGNOSIS — Z98891 History of uterine scar from previous surgery: Secondary | ICD-10-CM

## 2023-11-23 DIAGNOSIS — O34211 Maternal care for low transverse scar from previous cesarean delivery: Principal | ICD-10-CM | POA: Diagnosis present

## 2023-11-23 DIAGNOSIS — R000 Tachycardia, unspecified: Secondary | ICD-10-CM | POA: Diagnosis present

## 2023-11-23 DIAGNOSIS — N838 Other noninflammatory disorders of ovary, fallopian tube and broad ligament: Secondary | ICD-10-CM | POA: Diagnosis present

## 2023-11-23 DIAGNOSIS — O98812 Other maternal infectious and parasitic diseases complicating pregnancy, second trimester: Secondary | ICD-10-CM

## 2023-11-23 DIAGNOSIS — O0993 Supervision of high risk pregnancy, unspecified, third trimester: Secondary | ICD-10-CM

## 2023-11-23 DIAGNOSIS — Z302 Encounter for sterilization: Secondary | ICD-10-CM

## 2023-11-23 DIAGNOSIS — Z91040 Latex allergy status: Secondary | ICD-10-CM | POA: Diagnosis not present

## 2023-11-23 DIAGNOSIS — A749 Chlamydial infection, unspecified: Secondary | ICD-10-CM | POA: Diagnosis present

## 2023-11-23 DIAGNOSIS — O0933 Supervision of pregnancy with insufficient antenatal care, third trimester: Secondary | ICD-10-CM

## 2023-11-23 DIAGNOSIS — O3483 Maternal care for other abnormalities of pelvic organs, third trimester: Secondary | ICD-10-CM | POA: Diagnosis present

## 2023-11-23 DIAGNOSIS — Z9079 Acquired absence of other genital organ(s): Secondary | ICD-10-CM

## 2023-11-23 DIAGNOSIS — O99810 Abnormal glucose complicating pregnancy: Secondary | ICD-10-CM | POA: Diagnosis present

## 2023-11-23 DIAGNOSIS — O09521 Supervision of elderly multigravida, first trimester: Secondary | ICD-10-CM | POA: Diagnosis present

## 2023-11-23 DIAGNOSIS — O09523 Supervision of elderly multigravida, third trimester: Secondary | ICD-10-CM | POA: Diagnosis not present

## 2023-11-23 DIAGNOSIS — Z5986 Financial insecurity: Secondary | ICD-10-CM

## 2023-11-23 DIAGNOSIS — D509 Iron deficiency anemia, unspecified: Secondary | ICD-10-CM | POA: Diagnosis present

## 2023-11-23 HISTORY — DX: Anemia, unspecified: D64.9

## 2023-11-23 LAB — GLUCOSE, CAPILLARY
Glucose-Capillary: 94 mg/dL (ref 70–99)
Glucose-Capillary: 75 mg/dL (ref 70–99)
Glucose-Capillary: 89 mg/dL (ref 70–99)
Glucose-Capillary: 101 mg/dL — ABNORMAL HIGH (ref 70–99)
Glucose-Capillary: 108 mg/dL — ABNORMAL HIGH (ref 70–99)
Glucose-Capillary: 92 mg/dL (ref 70–99)

## 2023-11-23 LAB — COMPREHENSIVE METABOLIC PANEL
ALT: 11 U/L (ref 0–44)
AST: 25 U/L (ref 15–41)
Albumin: 2.9 g/dL — ABNORMAL LOW (ref 3.5–5.0)
Alkaline Phosphatase: 109 U/L (ref 38–126)
Anion gap: 13 (ref 5–15)
BUN: 5 mg/dL — ABNORMAL LOW (ref 6–20)
CO2: 18 mmol/L — ABNORMAL LOW (ref 22–32)
Calcium: 9.1 mg/dL (ref 8.9–10.3)
Chloride: 105 mmol/L (ref 98–111)
Creatinine, Ser: 0.64 mg/dL (ref 0.44–1.00)
GFR, Estimated: >60 mL/min (ref >60)
Glucose, Bld: 99 mg/dL (ref 70–99)
Potassium: 3.8 mmol/L (ref 3.5–5.1)
Sodium: 136 mmol/L (ref 135–145)
Total Bilirubin: 0.6 mg/dL (ref <1.2)
Total Protein: 6.8 g/dL (ref 6.5–8.1)

## 2023-11-23 LAB — CBC
HCT: 29.6 % — ABNORMAL LOW (ref 36.0–46.0)
Hemoglobin: 9.4 g/dL — ABNORMAL LOW (ref 12.0–15.0)
MCH: 24.1 pg — ABNORMAL LOW (ref 26.0–34.0)
MCHC: 31.8 g/dL (ref 30.0–36.0)
MCV: 75.9 fL — ABNORMAL LOW (ref 80.0–100.0)
Platelets: 198 10*3/uL (ref 150–400)
RBC: 3.9 MIL/uL (ref 3.87–5.11)
RDW: 17.9 % — ABNORMAL HIGH (ref 11.5–15.5)
WBC: 7.1 10*3/uL (ref 4.0–10.5)
nRBC: 0 % (ref 0.0–0.2)

## 2023-11-23 LAB — TYPE AND SCREEN
ABO/RH(D): A POS
Antibody Screen: NEGATIVE

## 2023-11-23 LAB — HIV ANTIBODY (ROUTINE TESTING W REFLEX): HIV Screen 4th Generation wRfx: NONREACTIVE

## 2023-11-23 LAB — RPR: RPR Ser Ql: NONREACTIVE

## 2023-11-23 MED ORDER — OXYTOCIN-SODIUM CHLORIDE 30-0.9 UT/500ML-% IV SOLN
2.5000 [IU]/h | INTRAVENOUS | Status: DC
  Filled 2023-11-23: qty 500

## 2023-11-23 MED ORDER — LACTATED RINGERS IV SOLN
500.0000 mL | Freq: Once | INTRAVENOUS | Status: AC
  Administered 2023-11-23: 500 mL via INTRAVENOUS

## 2023-11-23 MED ORDER — TERBUTALINE SULFATE 1 MG/ML IJ SOLN
0.2500 mg | Freq: Once | INTRAMUSCULAR | Status: AC | PRN
  Administered 2023-11-24: 0.25 mg via SUBCUTANEOUS
  Filled 2023-11-23: qty 1

## 2023-11-23 MED ORDER — LACTATED RINGERS IV BOLUS
1000.0000 mL | Freq: Once | INTRAVENOUS | Status: AC
  Administered 2023-11-23: 1000 mL via INTRAVENOUS

## 2023-11-23 MED ORDER — SOD CITRATE-CITRIC ACID 500-334 MG/5ML PO SOLN: 30.0000 mL | ORAL | Status: DC | PRN

## 2023-11-23 MED ORDER — ACETAMINOPHEN 325 MG PO TABS: 650.0000 mg | ORAL_TABLET | ORAL | Status: DC | PRN

## 2023-11-23 MED ORDER — PHENYLEPHRINE 80 MCG/ML (10ML) SYRINGE FOR IV PUSH (FOR BLOOD PRESSURE SUPPORT)
80.0000 ug | PREFILLED_SYRINGE | INTRAVENOUS | Status: DC | PRN
  Administered 2023-11-23: 240 ug via INTRAVENOUS
  Filled 2023-11-23: qty 10

## 2023-11-23 MED ORDER — DIPHENHYDRAMINE HCL 50 MG/ML IJ SOLN: 12.5000 mg | INTRAMUSCULAR | Status: DC | PRN

## 2023-11-23 MED ORDER — OXYTOCIN-SODIUM CHLORIDE 30-0.9 UT/500ML-% IV SOLN
1.0000 m[IU]/min | INTRAVENOUS | Status: DC
  Administered 2023-11-23: 2 m[IU]/min via INTRAVENOUS

## 2023-11-23 MED ORDER — OXYCODONE-ACETAMINOPHEN 5-325 MG PO TABS: 2.0000 | ORAL_TABLET | ORAL | Status: DC | PRN

## 2023-11-23 MED ORDER — EPHEDRINE 5 MG/ML INJ
10.0000 mg | INTRAVENOUS | Status: DC | PRN
  Administered 2023-11-23: 10 mg via INTRAVENOUS

## 2023-11-23 MED ORDER — LACTATED RINGERS IV SOLN: INTRAVENOUS | Status: DC

## 2023-11-23 MED ORDER — OXYTOCIN BOLUS FROM INFUSION: 333.0000 mL | Freq: Once | INTRAVENOUS | Status: DC

## 2023-11-23 MED ORDER — LACTATED RINGERS IV SOLN
500.0000 mL | INTRAVENOUS | Status: DC | PRN
  Administered 2023-11-23 – 2023-11-24 (×3): 500 mL via INTRAVENOUS

## 2023-11-23 MED ORDER — PHENYLEPHRINE 80 MCG/ML (10ML) SYRINGE FOR IV PUSH (FOR BLOOD PRESSURE SUPPORT)
80.0000 ug | PREFILLED_SYRINGE | INTRAVENOUS | Status: AC | PRN
  Administered 2023-11-23 (×3): 80 ug via INTRAVENOUS

## 2023-11-23 MED ORDER — LIDOCAINE-EPINEPHRINE (PF) 2 %-1:200000 IJ SOLN
INTRAMUSCULAR | Status: DC | PRN
  Administered 2023-11-23: 5 mL via EPIDURAL

## 2023-11-23 MED ORDER — OXYCODONE-ACETAMINOPHEN 5-325 MG PO TABS: 1.0000 | ORAL_TABLET | ORAL | Status: DC | PRN

## 2023-11-23 MED ORDER — EPHEDRINE 5 MG/ML INJ
10.0000 mg | INTRAVENOUS | Status: DC | PRN
  Filled 2023-11-23: qty 5

## 2023-11-23 MED ORDER — FENTANYL-BUPIVACAINE-NACL 0.5-0.125-0.9 MG/250ML-% EP SOLN
12.0000 mL/h | EPIDURAL | Status: DC | PRN
  Administered 2023-11-23: 12 mL/h via EPIDURAL
  Filled 2023-11-23: qty 250

## 2023-11-23 MED ORDER — ONDANSETRON HCL 4 MG/2ML IJ SOLN
4.0000 mg | Freq: Four times a day (QID) | INTRAMUSCULAR | Status: DC | PRN
  Administered 2023-11-23: 4 mg via INTRAVENOUS
  Filled 2023-11-23: qty 2

## 2023-11-23 MED ORDER — LIDOCAINE HCL (PF) 1 % IJ SOLN: 30.0000 mL | INTRAMUSCULAR | Status: DC | PRN

## 2023-11-23 NOTE — H&P (Addendum)
Obstetric Preoperative History and Physical  Melissa Delacruz is a 37 y.o. [id number] with IUP at [redacted]w[redacted]d presenting for labor with dilation to 3.5cm  Prior CSx2 CS#1- Arrest of dilation at 4cm, was in labor for 16 hours total CS#2- presented in labor but never dilated past 4 cm "after 4 hours."  Supported by her sister at bedside Reports good fetal movement, no bleeding, no contractions, no leaking of fluid.  No acute preoperative concerns.    Prenatal Course Source of Care: [facility]  with onset of care at 36 weeks - transfer from [place]  Pregnancy complications or risks: Patient Active Problem List   Diagnosis Date Noted   Iron deficiency anemia during pregnancy 11/06/2023   Abnormal glucose tolerance test (GTT) during pregnancy, antepartum 11/06/2023   Supervision of high risk pregnancy in third trimester 11/06/2023   Chlamydia infection affecting pregnancy 11/06/2023   History of 2 cesarean sections 06/09/2023   AMA (advanced maternal age) multigravida 35+, first trimester 06/09/2023   She plans to breastfeed She desires bilateral tubal ligation for postpartum contraception.   Prenatal labs and studies: ABO, Rh:  A positive Antibody:  Negative Rubella:  Immune RPR: non reactive (06/14 0000)  HBsAg:   Negative HIV:   Negative AVW:[id number]/-- (12/09 1150)  Third trimester: 1 hr GTT 155   Genetic screening  LR NIPS Anatomy US  reportedly normal, no records available  Prenatal Transfer Tool  Maternal Diabetes: No; testing never completed Genetic Screening: Normal Maternal Ultrasounds/Referrals: Other: reportedly normal, no record Fetal Ultrasounds or other Referrals:  None Maternal Substance Abuse:  No Significant Maternal Medications:  None Significant Maternal Lab Results: Group B Strep negative  Past Medical History:  Diagnosis Date   Asthma    Scoliosis     Past Surgical History:  Procedure Laterality Date   CESAREAN SECTION      OB History  Gravida Para Term  Preterm AB Living  4 2 2  0 0 2  SAB IAB Ectopic Multiple Live Births  0 0 0 0 2    # Outcome Date GA Lbr Len/2nd Weight Sex Type Anes PTL Lv  4 Current           3 Gravida           2 Term      CS-LTranv   LIV  1 Term      CS-LTranv   LIV    Social History   Socioeconomic History   Marital status: Single    Spouse name: Not on file   Number of children: Not on file   Years of education: Not on file   Highest education level: Not on file  Occupational History   Not on file  Tobacco Use   Smoking status: Never   Smokeless tobacco: Never  Vaping Use   Vaping status: Never Used  Substance and Sexual Activity   Alcohol use: No   Drug use: No   Sexual activity: Yes    Birth control/protection: None  Other Topics Concern   Not on file  Social History Narrative   Not on file   Social Drivers of Health   Financial Resource Strain: High Risk (04/28/2023)   Received from [facility]   Overall Financial Resource Strain (CARDIA)    Difficulty of Paying Living Expenses: Very hard  Food Insecurity: Food Insecurity Present (04/28/2023)   Received from [facility]   Hunger Vital Sign    Worried About Running Out of Food in the Last Year:  Often true    Ran Out of Food in the Last Year: Often true  Transportation Needs: No Transportation Needs (04/28/2023)   Received from [facility]   PRAPARE - Transportation    Lack of Transportation (Medical): No    Lack of Transportation (Non-Medical): No  Physical Activity: Not on file  Stress: Stress Concern Present (04/28/2023)   Received from [facility]   [facility] of Occupational Health - Occupational Stress Questionnaire    Feeling of Stress : To some extent  Social Connections: Not on file    Family History  Problem Relation Age of Onset   Hypertension Mother    Stroke Father    Stroke Maternal Aunt    Heart disease Maternal Grandmother     Medications Prior to Admission  Medication Sig Dispense Refill Last Dose/Taking    ferrous sulfate 325 (65 FE) MG tablet Take 325 mg by mouth daily with breakfast.   11/22/2023   Prenatal MV-Min-Fe Fum-FA-DHA (PRENATAL+DHA PO) Take 1 tablet by mouth daily at 12 noon.   11/22/2023    Allergies  Allergen Reactions   Latex Rash    Review of Systems: Pertinent items noted in HPI and remainder of comprehensive ROS otherwise negative.  Physical Exam: BP 107/66   Pulse 88   Temp 97.6 F (36.4 C)   Resp 18   LMP 02/10/2023  FHR by Doppler: 146 bpm   140/moderate/+accels, some early decels occasionally with contractions CONSTITUTIONAL: Well-developed, well-nourished female in no acute distress.  HENT:  Normocephalic, atraumatic, External right and left ear normal. Oropharynx is clear and moist EYES: Conjunctivae and EOM are normal. Pupils are equal, round, and reactive to light. No scleral icterus.  NECK: Normal range of motion, supple, no masses SKIN: Skin is warm and dry. No rash noted. Not diaphoretic. No erythema. No pallor. NEUROLOGIC: Alert and oriented to person, place, and time. Normal reflexes, muscle tone coordination. No cranial nerve deficit noted. PSYCHIATRIC: Normal mood and affect. Normal behavior. Normal judgment and thought content. CARDIOVASCULAR: Normal heart rate noted, regular rhythm RESPIRATORY: Effort and breath sounds normal, no problems with respiration noted ABDOMEN: Soft, nontender, nondistended, gravid. Well-healed Pfannenstiel incision. PELVIC: Deferred MUSCULOSKELETAL: Normal range of motion. No edema and no tenderness. 2+ distal pulses.  Pertinent Labs/Studies:   No results found for this or any previous visit (from the past 72 hours).  Assessment and Plan: Melissa Delacruz is a 38 y.o. G4P2002 at [redacted]w[redacted]d in [place]  Labor: Expectant management. CTX currently every 2-5 minutes. Will consider AROM  We discussed in detail TOLAC vs RCS.   Patient with a history of c-section for Arrest of dilation at 4 cm  We reviewed that a TOLAC is  reasonable for those with history of two prior C-section.   Risk of uterine rupture at term is 0.78 percent with TOLAC and 0.22 percent with ERCD. 1 in 10 uterine ruptures will result in neonatal death or neurological injury. The benefits of a trial of labor after cesarean (TOLAC) resulting in a vaginal birth after cesarean (VBAC) include the following: shorter length of hospital stay and postpartum recovery (in most cases); fewer complications, such as postpartum fever, wound or uterine infection, thromboembolism (blood clots in the leg or lung), need for blood transfusion and fewer neonatal breathing problems. The risks of an attempted VBAC or TOLAC include the following: Risk of failed trial of labor after cesarean (TOLAC) without a vaginal birth after cesarean (VBAC) resulting in repeat cesarean delivery (RCD) in about 20  to 40 percent of women who attempt VBAC.  Risk of rupture of uterus resulting in an emergency cesarean delivery. The risk of uterine rupture may be related in part to the type of uterine incision made during the first cesarean delivery. A previous transverse uterine incision has the lowest risk of rupture (0.2 to 1.5 percent risk). Vertical or T-shaped uterine incisions have a higher risk of uterine rupture (4 to 9 percent risk)The risk of fetal death is very low with both VBAC and elective repeat cesarean delivery (ERCD), but the likelihood of fetal death is higher with VBAC than with ERCD. Maternal death is very rare with either type of delivery. The risks of an elective repeat cesarean delivery (ERCD) were reviewed with the patient including but not limited to: 12/998 risk of uterine rupture which could have serious consequences, bleeding which may require transfusion; infection which may require antibiotics; injury to bowel, bladder or other surrounding organs (bowel, bladder, ureters); injury to the fetus; need for additional procedures including hysterectomy in the event of a  life-threatening hemorrhage; thromboembolic phenomenon; abnormal placentation; incisional problems; death and other postoperative or anesthesia complications.     These risks and benefits are summarized on the consent form, which was reviewed with the patient during the visit.  All her questions answered and she signed a consent indicating a preference for TOLAC. A copy of the consent was given to the patient.     Pain: Desires epidural, Support early epidural and order placed to received when desired.  FWB: Cat 1 ID: GBS negative. CT in 3rd trimester but TOC at 36 neg Circ: NA, female fetus  Wyn Forster, MD  11/23/23  7:51 AM

## 2023-11-23 NOTE — Anesthesia Procedure Notes (Signed)
Epidural Patient location during procedure: OB Start time: 11/23/2023 10:40 AM End time: 11/23/2023 10:50 AM  Staffing Anesthesiologist: Elmer Picker, MD Performed: anesthesiologist   Preanesthetic Checklist Completed: patient identified, IV checked, risks and benefits discussed, monitors and equipment checked, pre-op evaluation and timeout performed  Epidural Patient position: sitting Prep: DuraPrep and site prepped and draped Patient monitoring: continuous pulse ox, blood pressure, heart rate and cardiac monitor Approach: midline Location: L3-L4 Injection technique: LOR air  Needle:  Needle type: Tuohy  Needle gauge: 17 G Needle length: 9 cm Needle insertion depth: 6 cm Catheter type: closed end flexible Catheter size: 19 Gauge Catheter at skin depth: 11 cm Test dose: negative  Assessment Sensory level: T8 Events: blood not aspirated, no cerebrospinal fluid, injection not painful, no injection resistance, no paresthesia and negative IV test  Additional Notes Patient identified. Risks/Benefits/Options discussed with patient including but not limited to bleeding, infection, nerve damage, paralysis, failed block, incomplete pain control, headache, blood pressure changes, nausea, vomiting, reactions to medication both or allergic, itching and postpartum back pain. Confirmed with bedside nurse the patient's most recent platelet count. Confirmed with patient that they are not currently taking any anticoagulation, have any bleeding history or any family history of bleeding disorders. Patient expressed understanding and wished to proceed. All questions were answered. Sterile technique was used throughout the entire procedure. Please see nursing notes for vital signs. Test dose was given through epidural catheter and negative prior to continuing to dose epidural or start infusion. Warning signs of high block given to the patient including shortness of breath, tingling/numbness in  hands, complete motor block, or any concerning symptoms with instructions to call for help. Patient was given instructions on fall risk and not to get out of bed. All questions and concerns addressed with instructions to call with any issues or inadequate analgesia.  Reason for block:procedure for pain

## 2023-11-23 NOTE — Progress Notes (Signed)
Labor Progress Note Melissa Delacruz is a 37 y.o. G3P2002 at [redacted]w[redacted]d presented for SOL, h/o CS x2  S: comfortable with epidural  O:  BP (!) 79/41   Pulse (!) 159   Temp 98.5 F (36.9 C) (Oral)   Resp 18   LMP 02/10/2023   SpO2 100%  EFM: 140s/moderate/+accels, no decels  CVE: Dilation: 8 Effacement (%): 70 Cervical Position: Anterior Station: -1 Presentation: Vertex Exam by:: Dr. Alvester Morin  AROM with copious clear fluid  A&P: 37 y.o. G3P2002 [redacted]w[redacted]d  SOL  #Labor: Progressing well. Not on pitocin. AROM performed #Pain: s/p epidural #FWB: Cat 1 #GBS negative  #Tachycardia-- suspect SVT vs atrial arrhythmia. Patient reports having palpitations in the 3rd part of pregnancy with associated nausea and this was resolved with deep breathing and putting her head between her knees. EKG ordered  Federico Flake, MD 4:22 PM

## 2023-11-23 NOTE — MAU Note (Signed)
.  Melissa Delacruz is a 37 y.o. at [redacted]w[redacted]d here in MAU reporting: contractions since last night.now about 3 min apart. Denies any vag bleeding or leaking. Good fetal movement reported   LMP:  Onset of complaint: last night Pain score: 8 Vitals:   11/23/23 0725  BP: 107/66  Pulse: 88  Resp: 18  Temp: 97.6 F (36.4 C)     FHT:146 Lab orders placed from triage: Labor eval

## 2023-11-23 NOTE — Anesthesia Preprocedure Evaluation (Addendum)
Anesthesia Evaluation  Patient identified by MRN, date of birth, ID band Patient awake    Reviewed: Allergy & Precautions, NPO status , Patient's Chart, lab work & pertinent test results  Airway Mallampati: II  TM Distance: >3 FB Neck ROM: Full    Dental no notable dental hx.    Pulmonary asthma    Pulmonary exam normal breath sounds clear to auscultation       Cardiovascular negative cardio ROS Normal cardiovascular exam Rhythm:Regular Rate:Normal     Neuro/Psych negative neurological ROS  negative psych ROS   GI/Hepatic negative GI ROS, Neg liver ROS,,,  Endo/Other  negative endocrine ROS    Renal/GU negative Renal ROS  negative genitourinary   Musculoskeletal negative musculoskeletal ROS (+)  Scoliosis   Abdominal   Peds  Hematology  (+) Blood dyscrasia, anemia Lab Results      Component                Value               Date                      WBC                      7.1                 11/23/2023                HGB                      9.4 (L)             11/23/2023                HCT                      29.6 (L)            11/23/2023                MCV                      75.9 (L)            11/23/2023                PLT                      198                 11/23/2023              Anesthesia Other Findings Presents in labor, TOLAC, h/o C/Sx2  Reproductive/Obstetrics (+) Pregnancy                             Anesthesia Physical Anesthesia Plan  ASA: 2  Anesthesia Plan: Epidural   Post-op Pain Management:    Induction:   PONV Risk Score and Plan: Treatment may vary due to age or medical condition  Airway Management Planned: Natural Airway  Additional Equipment:   Intra-op Plan:   Post-operative Plan:   Informed Consent: I have reviewed the patients History and Physical, chart, labs and discussed the procedure including the risks, benefits and alternatives  for the proposed anesthesia with the patient or authorized representative who has indicated his/her understanding and acceptance.  Plan Discussed with: Anesthesiologist  Anesthesia Plan Comments: (Patient identified. Risks, benefits, options discussed with patient including but not limited to bleeding, infection, nerve damage, paralysis, failed block, incomplete pain control, headache, blood pressure changes, nausea, vomiting, reactions to medication, itching, and post partum back pain. Confirmed with bedside nurse the patient's most recent platelet count. Confirmed with the patient that they are not taking any anticoagulation, have any bleeding history or any family history of bleeding disorders. Patient expressed understanding and wishes to proceed. All questions were answered. )       Anesthesia Quick Evaluation

## 2023-11-23 NOTE — Progress Notes (Signed)
LABOR PROGRESS NOTE  Patient Name: Melissa Delacruz, female   DOB: [birth year], 37 y.o.  MRN: [id number]  [id number] at [redacted]w[redacted]d admitted for SOL, TOLAC, hx C/S x2  S: comfortable with epidural  O:  BP 107/68   Pulse 97   Temp 97.9 F (36.6 C) (Oral)   Resp 18   LMP 02/10/2023   SpO2 100%  EFM:135 bpm/Moderate variability/ 15x15 accels/ Early decels CAT: 1 Toco: regular, every 5 minutes   CVE: Dilation: 7.5 Effacement (%): 80 Cervical Position: Anterior Station: -2 Presentation: Vertex Exam by:: Earlene Plater, MD   A&P:   #Labor: No longer 9 cm. Feel that babe is not well applied as I suspect she was earlier and ctx have spaced. Start pit 2x2 #Pain: Epidural #FWB: CAT 1 #GBS negative #Anticipate vaginal delivery  Joanne Gavel, MD FMOB Fellow, Faculty practice [facility], Center for [facility] 11/23/23  8:47 PM

## 2023-11-24 ENCOUNTER — Encounter (HOSPITAL_COMMUNITY): Payer: Self-pay | Admitting: Family Medicine

## 2023-11-24 ENCOUNTER — Other Ambulatory Visit: Payer: Self-pay

## 2023-11-24 ENCOUNTER — Encounter (HOSPITAL_COMMUNITY)
Admission: AD | Disposition: A | Discharge status: Home / Self Care | Payer: Self-pay | Source: Home / Self Care | Attending: Family Medicine

## 2023-11-24 ENCOUNTER — Encounter (HOSPITAL_COMMUNITY)
Admission: RE | Admit: 2023-11-24 | Discharge: 2023-11-24 | Disposition: A | Discharge status: Home / Self Care | Payer: No Typology Code available for payment source | Source: Ambulatory Visit | Attending: Family Medicine | Admitting: Family Medicine

## 2023-11-24 DIAGNOSIS — O34211 Maternal care for low transverse scar from previous cesarean delivery: Secondary | ICD-10-CM

## 2023-11-24 DIAGNOSIS — Z3A39 39 weeks gestation of pregnancy: Secondary | ICD-10-CM

## 2023-11-24 DIAGNOSIS — Z9079 Acquired absence of other genital organ(s): Secondary | ICD-10-CM

## 2023-11-24 DIAGNOSIS — O09523 Supervision of elderly multigravida, third trimester: Secondary | ICD-10-CM

## 2023-11-24 DIAGNOSIS — Z302 Encounter for sterilization: Secondary | ICD-10-CM

## 2023-11-24 LAB — CBC
HCT: 25.6 % — ABNORMAL LOW (ref 36.0–46.0)
Hemoglobin: 8.1 g/dL — ABNORMAL LOW (ref 12.0–15.0)
MCH: 24.8 pg — ABNORMAL LOW (ref 26.0–34.0)
MCHC: 31.6 g/dL (ref 30.0–36.0)
MCV: 78.3 fL — ABNORMAL LOW (ref 80.0–100.0)
Platelets: 168 10*3/uL (ref 150–400)
RBC: 3.27 MIL/uL — ABNORMAL LOW (ref 3.87–5.11)
RDW: 18.1 % — ABNORMAL HIGH (ref 11.5–15.5)
WBC: 13.2 10*3/uL — ABNORMAL HIGH (ref 4.0–10.5)
nRBC: 0 % (ref 0.0–0.2)

## 2023-11-24 SURGERY — Surgical Case
Anesthesia: Epidural | Site: Abdomen

## 2023-11-24 MED ORDER — MORPHINE SULFATE (PF) 0.5 MG/ML IJ SOLN
INTRAMUSCULAR | Status: DC | PRN
  Administered 2023-11-24: 3 mg via EPIDURAL

## 2023-11-24 MED ORDER — LACTATED RINGERS IV SOLN: INTRAVENOUS | Status: AC

## 2023-11-24 MED ORDER — LACTATED RINGERS IV SOLN: INTRAVENOUS | Status: DC | PRN

## 2023-11-24 MED ORDER — SIMETHICONE 80 MG PO CHEW
80.0000 mg | CHEWABLE_TABLET | Freq: Three times a day (TID) | ORAL | Status: DC
  Administered 2023-11-24 – 2023-11-25 (×6): 80 mg via ORAL
  Filled 2023-11-24 (×5): qty 1

## 2023-11-24 MED ORDER — MEDROXYPROGESTERONE ACETATE 150 MG/ML IM SUSP: 150.0000 mg | INTRAMUSCULAR | Status: DC | PRN

## 2023-11-24 MED ORDER — ACETAMINOPHEN 10 MG/ML IV SOLN
INTRAVENOUS | Status: DC | PRN
  Administered 2023-11-24: 1000 mg via INTRAVENOUS

## 2023-11-24 MED ORDER — MEASLES, MUMPS & RUBELLA VAC IJ SOLR: 0.5000 mL | Freq: Once | INTRAMUSCULAR | Status: DC

## 2023-11-24 MED ORDER — METOCLOPRAMIDE HCL 5 MG/ML IJ SOLN
INTRAMUSCULAR | Status: AC
Start: 2023-11-24 — End: ?
  Filled 2023-11-24: qty 2

## 2023-11-24 MED ORDER — FENTANYL CITRATE (PF) 100 MCG/2ML IJ SOLN
INTRAMUSCULAR | Status: AC
  Filled 2023-11-24: qty 2

## 2023-11-24 MED ORDER — ONDANSETRON HCL 4 MG/2ML IJ SOLN
INTRAMUSCULAR | Status: DC | PRN
  Administered 2023-11-24: 4 mg via INTRAVENOUS

## 2023-11-24 MED ORDER — ACETAMINOPHEN 10 MG/ML IV SOLN: 1000.0000 mg | Freq: Once | INTRAVENOUS | Status: DC | PRN

## 2023-11-24 MED ORDER — MEPERIDINE HCL 25 MG/ML IJ SOLN: 6.2500 mg | INTRAMUSCULAR | Status: DC | PRN

## 2023-11-24 MED ORDER — TETANUS-DIPHTH-ACELL PERTUSSIS 5-2.5-18.5 LF-MCG/0.5 IM SUSY: 0.5000 mL | PREFILLED_SYRINGE | Freq: Once | INTRAMUSCULAR | Status: DC

## 2023-11-24 MED ORDER — OXYCODONE HCL 5 MG PO TABS: 5.0000 mg | ORAL_TABLET | Freq: Once | ORAL | Status: DC | PRN

## 2023-11-24 MED ORDER — SODIUM CHLORIDE 0.9 % IR SOLN
Status: DC | PRN
  Administered 2023-11-24: 1

## 2023-11-24 MED ORDER — NALOXONE HCL 4 MG/10ML IJ SOLN: 1.0000 ug/kg/h | INTRAVENOUS | Status: DC | PRN

## 2023-11-24 MED ORDER — OXYCODONE HCL 5 MG/5ML PO SOLN: 5.0000 mg | Freq: Once | ORAL | Status: DC | PRN

## 2023-11-24 MED ORDER — NALOXONE HCL 0.4 MG/ML IJ SOLN: 0.4000 mg | INTRAMUSCULAR | Status: DC | PRN

## 2023-11-24 MED ORDER — ACETAMINOPHEN 10 MG/ML IV SOLN
INTRAVENOUS | Status: AC
  Filled 2023-11-24: qty 100

## 2023-11-24 MED ORDER — DIPHENHYDRAMINE HCL 25 MG PO CAPS: 25.0000 mg | ORAL_CAPSULE | Freq: Four times a day (QID) | ORAL | Status: DC | PRN

## 2023-11-24 MED ORDER — CEFAZOLIN SODIUM-DEXTROSE 2-3 GM-%(50ML) IV SOLR
INTRAVENOUS | Status: DC | PRN
  Administered 2023-11-24: 2 g via INTRAVENOUS

## 2023-11-24 MED ORDER — SODIUM CHLORIDE 0.9 % IV SOLN
500.0000 mg | INTRAVENOUS | Status: AC
  Administered 2023-11-24: 500 mg via INTRAVENOUS

## 2023-11-24 MED ORDER — SCOPOLAMINE 1 MG/3DAYS TD PT72: 1.0000 | MEDICATED_PATCH | Freq: Once | TRANSDERMAL | Status: DC

## 2023-11-24 MED ORDER — COCONUT OIL OIL
1.0000 | TOPICAL_OIL | Status: DC | PRN
Start: 2023-11-24 — End: 2023-11-26

## 2023-11-24 MED ORDER — SODIUM CHLORIDE 0.9 % IV SOLN
INTRAVENOUS | Status: AC
  Filled 2023-11-24: qty 5

## 2023-11-24 MED ORDER — ONDANSETRON HCL 4 MG/2ML IJ SOLN
INTRAMUSCULAR | Status: AC
  Filled 2023-11-24: qty 2

## 2023-11-24 MED ORDER — KETOROLAC TROMETHAMINE 30 MG/ML IJ SOLN
30.0000 mg | Freq: Four times a day (QID) | INTRAMUSCULAR | Status: AC
  Administered 2023-11-24 (×4): 30 mg via INTRAVENOUS
  Filled 2023-11-24 (×4): qty 1

## 2023-11-24 MED ORDER — SOD CITRATE-CITRIC ACID 500-334 MG/5ML PO SOLN
30.0000 mL | ORAL | Status: DC
  Filled 2023-11-24: qty 30

## 2023-11-24 MED ORDER — FENTANYL CITRATE (PF) 100 MCG/2ML IJ SOLN
25.0000 ug | INTRAMUSCULAR | Status: DC | PRN
  Administered 2023-11-24: 25 ug via INTRAVENOUS

## 2023-11-24 MED ORDER — DEXAMETHASONE SODIUM PHOSPHATE 10 MG/ML IJ SOLN
INTRAMUSCULAR | Status: DC | PRN
  Administered 2023-11-24: 10 mg via INTRAVENOUS

## 2023-11-24 MED ORDER — TRANEXAMIC ACID-NACL 1000-0.7 MG/100ML-% IV SOLN
INTRAVENOUS | Status: AC
Start: 2023-11-24 — End: ?
  Filled 2023-11-24: qty 100

## 2023-11-24 MED ORDER — OXYTOCIN-SODIUM CHLORIDE 30-0.9 UT/500ML-% IV SOLN
2.5000 [IU]/h | INTRAVENOUS | Status: AC
  Administered 2023-11-24 (×2): 2.5 [IU]/h via INTRAVENOUS
  Filled 2023-11-24: qty 500

## 2023-11-24 MED ORDER — MORPHINE SULFATE (PF) 0.5 MG/ML IJ SOLN
INTRAMUSCULAR | Status: AC
  Filled 2023-11-24: qty 10

## 2023-11-24 MED ORDER — OXYTOCIN-SODIUM CHLORIDE 30-0.9 UT/500ML-% IV SOLN
INTRAVENOUS | Status: AC
  Filled 2023-11-24: qty 500

## 2023-11-24 MED ORDER — TRANEXAMIC ACID-NACL 1000-0.7 MG/100ML-% IV SOLN
INTRAVENOUS | Status: DC | PRN
  Administered 2023-11-24: 1000 mg via INTRAVENOUS

## 2023-11-24 MED ORDER — AMISULPRIDE (ANTIEMETIC) 5 MG/2ML IV SOLN: 10.0000 mg | Freq: Once | INTRAVENOUS | Status: DC | PRN

## 2023-11-24 MED ORDER — DIPHENHYDRAMINE HCL 25 MG PO CAPS
25.0000 mg | ORAL_CAPSULE | Freq: Four times a day (QID) | ORAL | Status: DC | PRN
Start: 2023-11-24 — End: 2023-11-26

## 2023-11-24 MED ORDER — PRENATAL MULTIVITAMIN CH
1.0000 | ORAL_TABLET | Freq: Every day | ORAL | Status: DC
  Filled 2023-11-24: qty 1

## 2023-11-24 MED ORDER — ACETAMINOPHEN 325 MG PO TABS: 650.0000 mg | ORAL_TABLET | ORAL | Status: DC | PRN

## 2023-11-24 MED ORDER — DEXAMETHASONE SODIUM PHOSPHATE 10 MG/ML IJ SOLN
INTRAMUSCULAR | Status: AC
  Filled 2023-11-24: qty 1

## 2023-11-24 MED ORDER — SODIUM CHLORIDE 0.9% FLUSH: 3.0000 mL | INTRAVENOUS | Status: DC | PRN

## 2023-11-24 MED ORDER — LIDOCAINE-EPINEPHRINE (PF) 1.5 %-1:200000 IJ SOLN
INTRAMUSCULAR | Status: DC | PRN
  Administered 2023-11-24: 2 mL via EPIDURAL
  Administered 2023-11-24: 5 mL via EPIDURAL

## 2023-11-24 MED ORDER — STERILE WATER FOR IRRIGATION IR SOLN
Status: DC | PRN
  Administered 2023-11-24: 1000 mL

## 2023-11-24 MED ORDER — SENNOSIDES-DOCUSATE SODIUM 8.6-50 MG PO TABS
2.0000 | ORAL_TABLET | Freq: Every day | ORAL | Status: DC
  Filled 2023-11-24: qty 2

## 2023-11-24 MED ORDER — DIPHENHYDRAMINE HCL 50 MG/ML IJ SOLN: 12.5000 mg | Freq: Four times a day (QID) | INTRAMUSCULAR | Status: DC | PRN

## 2023-11-24 MED ORDER — DIBUCAINE (PERIANAL) 1 % EX OINT: 1.0000 | TOPICAL_OINTMENT | CUTANEOUS | Status: DC | PRN

## 2023-11-24 MED ORDER — OXYCODONE HCL 5 MG PO TABS: 5.0000 mg | ORAL_TABLET | ORAL | Status: DC | PRN

## 2023-11-24 MED ORDER — SIMETHICONE 80 MG PO CHEW
80.0000 mg | CHEWABLE_TABLET | ORAL | Status: DC | PRN
  Filled 2023-11-24: qty 1

## 2023-11-24 MED ORDER — FENTANYL CITRATE (PF) 100 MCG/2ML IJ SOLN
INTRAMUSCULAR | Status: DC | PRN
  Administered 2023-11-24: 100 ug via EPIDURAL

## 2023-11-24 MED ORDER — CEFAZOLIN SODIUM-DEXTROSE 2-4 GM/100ML-% IV SOLN
INTRAVENOUS | Status: AC
  Filled 2023-11-24: qty 100

## 2023-11-24 MED ORDER — WITCH HAZEL-GLYCERIN EX PADS: 1.0000 | MEDICATED_PAD | CUTANEOUS | Status: DC | PRN

## 2023-11-24 MED ORDER — IBUPROFEN 600 MG PO TABS
600.0000 mg | ORAL_TABLET | Freq: Four times a day (QID) | ORAL | Status: DC
  Filled 2023-11-24: qty 1

## 2023-11-24 MED ORDER — METOCLOPRAMIDE HCL 5 MG/ML IJ SOLN
INTRAMUSCULAR | Status: DC | PRN
  Administered 2023-11-24: 10 mg via INTRAVENOUS

## 2023-11-24 MED ORDER — ONDANSETRON HCL 4 MG/2ML IJ SOLN: 4.0000 mg | Freq: Three times a day (TID) | INTRAMUSCULAR | Status: DC | PRN

## 2023-11-24 MED ORDER — ENOXAPARIN SODIUM 40 MG/0.4ML IJ SOSY
40.0000 mg | PREFILLED_SYRINGE | INTRAMUSCULAR | Status: DC
  Administered 2023-11-24 – 2023-11-25 (×2): 40 mg via SUBCUTANEOUS
  Filled 2023-11-24: qty 0.4

## 2023-11-24 MED ORDER — OXYTOCIN-SODIUM CHLORIDE 30-0.9 UT/500ML-% IV SOLN
INTRAVENOUS | Status: DC | PRN
  Administered 2023-11-24: 999 mL/h via INTRAVENOUS

## 2023-11-24 MED ORDER — CEFAZOLIN SODIUM-DEXTROSE 2-4 GM/100ML-% IV SOLN: 2.0000 g | INTRAVENOUS | Status: DC

## 2023-11-24 MED ORDER — TRANEXAMIC ACID-NACL 1000-0.7 MG/100ML-% IV SOLN: 1000.0000 mg | INTRAVENOUS | Status: DC

## 2023-11-24 MED ORDER — MENTHOL 3 MG MT LOZG: 1.0000 | LOZENGE | OROMUCOSAL | Status: DC | PRN

## 2023-11-24 SURGICAL SUPPLY — 27 items
CHLORAPREP W/TINT 26 (MISCELLANEOUS) ×2 IMPLANT
DERMABOND ADVANCED .7 DNX12 (GAUZE/BANDAGES/DRESSINGS) IMPLANT
DRSG OPSITE POSTOP 4X10 (GAUZE/BANDAGES/DRESSINGS) ×1 IMPLANT
ELECT REM PT RETURN 9FT ADLT (ELECTROSURGICAL) ×1
ELECTRODE REM PT RTRN 9FT ADLT (ELECTROSURGICAL) ×1 IMPLANT
GAUZE PAD ABD 7.5X8 STRL (GAUZE/BANDAGES/DRESSINGS) IMPLANT
GAUZE SPONGE 4X4 12PLY STRL LF (GAUZE/BANDAGES/DRESSINGS) IMPLANT
GLOVE BIO SURGEON STRL SZ 6.5 (GLOVE) ×1 IMPLANT
GLOVE BIOGEL PI IND STRL 7.0 (GLOVE) ×2 IMPLANT
GOWN STRL REUS W/TWL LRG LVL3 (GOWN DISPOSABLE) ×2 IMPLANT
KIT ABG SYR 3ML LUER SLIP (SYRINGE) IMPLANT
LIGASURE IMPACT 36 18CM CVD LR (INSTRUMENTS) IMPLANT
NDL HYPO 25X5/8 SAFETYGLIDE (NEEDLE) IMPLANT
NEEDLE HYPO 22GX1.5 SAFETY (NEEDLE) IMPLANT
NEEDLE HYPO 25X5/8 SAFETYGLIDE (NEEDLE) IMPLANT
NS IRRIG 1000ML POUR BTL (IV SOLUTION) ×1 IMPLANT
PACK C SECTION WH (CUSTOM PROCEDURE TRAY) ×1 IMPLANT
PAD OB MATERNITY 4.3X12.25 (PERSONAL CARE ITEMS) ×1 IMPLANT
RETRACTOR WND ALEXIS 25 LRG (MISCELLANEOUS) IMPLANT
RTRCTR WOUND ALEXIS 25CM LRG (MISCELLANEOUS)
SUT VIC AB 0 CT1 36 (SUTURE) ×6 IMPLANT
SUT VIC AB 2-0 CT1 TAPERPNT 27 (SUTURE) ×1 IMPLANT
SUT VIC AB 4-0 PS2 27 (SUTURE) ×1 IMPLANT
SYR CONTROL 10ML LL (SYRINGE) IMPLANT
TOWEL OR 17X24 6PK STRL BLUE (TOWEL DISPOSABLE) ×1 IMPLANT
TRAY FOLEY W/BAG SLVR 14FR LF (SET/KITS/TRAYS/PACK) IMPLANT
WATER STERILE IRR 1000ML POUR (IV SOLUTION) ×1 IMPLANT

## 2023-11-24 NOTE — Progress Notes (Signed)
Dr Charlotta Newton scrubbed out of case after tubal ligation due to emergency on L&D. Pt hemostatically stable. Dr Earlene Plater remained scrubbed in. Dr Tenny Craw en route to OR, scrubbed in and resumed case.

## 2023-11-24 NOTE — Discharge Summary (Signed)
Postpartum Discharge Summary  Date of Service updated***     Patient Name: Melissa Delacruz DOB: [birth year] MRN: [id number]  Date of admission: 11/23/2023 Delivery date:11/24/2023 Delivering provider: Myna Hidalgo Date of discharge: 11/24/2023  Admitting diagnosis: Indication for care in labor or delivery [O75.9] Intrauterine pregnancy: [redacted]w[redacted]d     Secondary diagnosis:  Principal Problem:   Status post repeat low transverse cesarean section Active Problems:   AMA (advanced maternal age) multigravida 35+, first trimester   Iron deficiency anemia during pregnancy   Abnormal glucose tolerance test (GTT) during pregnancy, antepartum   Supervision of high risk pregnancy in third trimester   Chlamydia infection affecting pregnancy   Late prenatal care affecting pregnancy in third trimester   Indication for care in labor or delivery   Status post bilateral salpingectomy  Additional problems: ***    Discharge diagnosis: Term Pregnancy Delivered                                              Post partum procedures:*** Augmentation: AROM and Pitocin Complications: None  Hospital course: Onset of Labor With Unplanned C/S   37 y.o. yo G3P3003 at [redacted]w[redacted]d was admitted in spontaneous Latent Labor on 11/23/2023. Had two prior C/S; elected to [facility]. Patient had a labor course significant for non-reassuring status and inability to augment after 8 cm. The patient went for cesarean section due to Non-Reassuring FHR. Delivery details as follows: Membrane Rupture Time/Date: 4:17 PM,11/23/2023  Delivery Method:C-Section, Low Transverse Operative Delivery:N/A Details of operation can be found in separate operative note. Patient had a postpartum course complicated by***.  She is ambulating,tolerating a regular diet, passing flatus, and urinating well.  Patient is discharged home in stable condition 11/24/23.  Newborn Data: Birth date:11/24/2023 Birth time:2:43 AM Gender:Female Living  status:Living Apgars:8 ,9  Weight:2910 g  Magnesium Sulfate received: No BMZ received: No Rhophylac:No MMR:No T-DaP:Given prenatally Flu: {NWG:95621} RSV Vaccine received: {RSV:31013} Transfusion:{Transfusion received:30440034}  Immunizations received: Immunization History  Administered Date(s) Administered   PPD Test 01/09/2018, 06/10/2020   Tdap 09/26/2023    Physical exam  Vitals:   11/23/23 2300 11/23/23 2330 11/24/23 0000 11/24/23 0351  BP: 103/62 (!) 110/57 103/62 114/67  Pulse: (!) 120 (!) 101 (!) 115 (!) 126  Resp:  18  20  Temp:  99 F (37.2 C)    TempSrc:  Axillary    SpO2: 100% 98% 100% 100%   General: {Exam; general:21111117} Lochia: {Desc; appropriate/inappropriate:30686::"appropriate"} Uterine Fundus: {Desc; firm/soft:30687} Incision: {Exam; incision:21111123} DVT Evaluation: {Exam; dvt:2111122} Labs: Lab Results  Component Value Date   WBC 7.1 11/23/2023   HGB 9.4 (L) 11/23/2023   HCT 29.6 (L) 11/23/2023   MCV 75.9 (L) 11/23/2023   PLT 198 11/23/2023      Latest Ref Rng & Units 11/23/2023    7:55 AM  CMP  Glucose 70 - 99 mg/dL 99   BUN 6 - 20 mg/dL 5   Creatinine 3.08 - 6.57 mg/dL 8.46   Sodium 962 - 952 mmol/L 136   Potassium 3.5 - 5.1 mmol/L 3.8   Chloride 98 - 111 mmol/L 105   CO2 22 - 32 mmol/L 18   Calcium 8.9 - 10.3 mg/dL 9.1   Total Protein 6.5 - 8.1 g/dL 6.8   Total Bilirubin <8.4 mg/dL 0.6   Alkaline Phos 38 - 126 U/L 109   AST 15 -  41 U/L 25   ALT 0 - 44 U/L 11    Edinburgh Score:     No data to display         No data recorded  After visit meds:  Allergies as of 11/24/2023       Reactions   Latex Rash     Med Rec must be completed prior to using this [facility]***        Discharge home in stable condition Infant Feeding: {Baby feeding:23562} Infant Disposition:{CHL IP OB HOME WITH WGNFAO:13086} Discharge instruction: per After Visit Summary and Postpartum booklet. Activity: Advance as tolerated. Pelvic  rest for 6 weeks.  Diet: {OB VHQI:69629528} Future Appointments:No future appointments. Follow up Visit:  Message sent to [facility] 12/27  Please schedule this patient for a In person postpartum visit in 4 weeks with the following provider: Any provider. Additional Postpartum F/U: Incision check 1 week  High risk pregnancy complicated by:  AMA, hx 2 prior C/S Delivery mode:  C-Section, Low Transverse Anticipated Birth Control:   BTS   11/24/2023 Joanne Gavel, MD

## 2023-11-24 NOTE — Transfer of Care (Signed)
Immediate Anesthesia Transfer of Care Note  Patient: Melissa Delacruz  Procedure(s) Performed: CESAREAN SECTION with bilateral tubal ligation via salpingectomy (Abdomen)  Patient Location: PACU  Anesthesia Type:Epidural  Level of Consciousness: awake, alert , and oriented  Airway & Oxygen Therapy: Patient Spontanous Breathing and Patient connected to nasal cannula oxygen  Post-op Assessment: Report given to RN and Post -op Vital signs reviewed and stable  Post vital signs: Reviewed and stable  Last Vitals:  Vitals Value Taken Time  BP 114/67 11/24/23 0350  Temp    Pulse 132 11/24/23 0351  Resp 20 11/24/23 0351  SpO2 96 % 11/24/23 0351  Vitals shown include unfiled device data.  Last Pain:  Vitals:   11/24/23 0000  TempSrc:   PainSc: 0-No pain         Complications: No notable events documented.

## 2023-11-24 NOTE — Progress Notes (Signed)
LABOR PROGRESS NOTE  Patient Name: Melissa Delacruz, female   DOB: September 05, 1986, 37 y.o.  MRN: [id number]  Increasingly minimal variability and subtle lates despite stopping of pitocin, position changes. Still remote from delivery. Discussed C/S for mode of delivery, and patient is agreeable.  Melissa Delacruz has agreed to proceed with cesarean section due to Non-reassuring FHR. The risks of surgery were discussed with the patient including but were not limited to: bleeding which may require transfusion or reoperation; infection which may require antibiotics; injury to bowel, bladder, ureters or other surrounding organs; injury to the fetus; need for additional procedures including hysterectomy in the event of a life-threatening hemorrhage; formation of adhesions; placental abnormalities wth subsequent pregnancies; incisional problems; thromboembolic phenomenon and other postoperative/anesthesia complications. The patient concurred with the proposed plan, giving informed written consent for the procedure. Anesthesia and OR aware. Preoperative prophylactic antibiotics and SCDs ordered on call to the OR. To OR when ready.    Joanne Gavel, MD FMOB Fellow, Faculty practice [facility], Center for [facility] 11/24/23  1:45 AM

## 2023-11-24 NOTE — Anesthesia Postprocedure Evaluation (Signed)
Anesthesia Post Note  Patient: Melissa Delacruz  Procedure(s) Performed: CESAREAN SECTION with bilateral tubal ligation via salpingectomy (Abdomen)     Patient location during evaluation: PACU Anesthesia Type: Epidural Level of consciousness: awake Pain management: pain level controlled Vital Signs Assessment: post-procedure vital signs reviewed and stable Respiratory status: spontaneous breathing, nonlabored ventilation and respiratory function stable Cardiovascular status: blood pressure returned to baseline and stable Postop Assessment: no apparent nausea or vomiting Anesthetic complications: no   No notable events documented.  Last Vitals:  Vitals:   11/24/23 0455 11/24/23 0607  BP: 105/62 95/63  Pulse: 92 79  Resp: 18 16  Temp: 36.7 C 36.5 C  SpO2: 100% 98%    Last Pain:  Vitals:   11/24/23 0607  TempSrc: Oral  PainSc: Asleep   Pain Goal:                   [name]

## 2023-11-24 NOTE — Op Note (Cosign Needed Addendum)
Cesarean Section Operative Note   Patient: Melissa Delacruz  Date of Procedure: 11/23/2023 - 11/24/2023  Procedure: Repeat Low Transverse Cesarean and Bilateral Tubal Ligation via Bilateral salpingectomy   Indications: non-reassuring fetal status  Pre-operative Diagnosis: fetal intolerance to labor non-reassuring fetal well-being, desired sterilzation.   Post-operative Diagnosis: Same  TOLAC Candidate: No  Surgeon: Surgeons and Role:    * Myna Hidalgo, DO - Primary    * Waynard Reeds, MD - Assisting    * Joanne Gavel, MD - Assisting  Assistants: An experienced assistant was required given the standard of surgical care given the complexity of the case.  This assistant was needed for exposure, dissection, suctioning, retraction, instrument exchange, assisting with delivery with administration of fundal pressure, and for overall help during the procedure.   Anesthesia: epidural  Anesthesiologist: [name], MD   Antibiotics: Cefazolin and Azithromycin   Estimated Blood Loss: 570 ml   Total IV Fluids: 1300 ml  Urine Output:  2175 cc OF pink colored urine  Specimens: bilateral fallopian tubes   Complications: no complications   Indications: Melissa Delacruz is a 37 y.o. [phone] with an IUP [redacted]w[redacted]d presenting for unscheduled, urgent cesarean secondary to the indications listed above. Clinical course notable for presentation to hospital with spontaneous onset of labor. Elected to [employer]. Fetal status became non-reassuring with minimal variability and recurrent decelerations.  The risks of cesarean section were discussed with the patient including but were not limited to: bleeding which may require transfusion or reoperation; infection which may require antibiotics; injury to bowel, bladder, ureters or other surrounding organs; injury to the fetus; need for additional procedures including hysterectomy in the event of a life-threatening hemorrhage; placental  abnormalities wth subsequent pregnancies, incisional problems, thromboembolic phenomenon and other postoperative/anesthesia complications.  Patient also desires permanent sterilization.  Other reversible forms of contraception were discussed with patient; she declines all other modalities. Risks of procedure discussed with patient including but not limited to: risk of regret, permanence of method, bleeding, infection, injury to surrounding organs and need for additional procedures.  Failure risk of about 1% with increased risk of ectopic gestation if pregnancy occurs was also discussed with patient.  Also discussed possibility of post-tubal pain syndrome. The patient concurred with the proposed plan, giving informed written consent for the procedures.  Patient NPO status waived given urgency of case. Anesthesia and OR aware.  Preoperative prophylactic antibiotics and SCDs ordered on call to the OR.   Findings: Viable infant in cephalic presentation, nuchal x4 . True knot also present. Apgars 8, 9, . Weight 2910 g. Moderate meconium stained amniotic fluid. Normal placenta, three vessel cord. Normal uterus, Normal bilateral fallopian tubes with paratubal cyst on left, Normal bilateral ovaries. Moderate adhesive disease encountered.  Procedure Details: A Time Out was held and the above information confirmed. The patient received intravenous antibiotics and had sequential compression devices applied to her lower extremities preoperatively. The patient was taken back to the operative suite where epidural anesthesia was administered. After induction of anesthesia, the patient was draped and prepped in the usual sterile manner and placed in a dorsal supine position with a leftward tilt. A low transverse skin incision was made with scalpel following prior scar and carried down through the subcutaneous tissue to the fascia. Fascial incision was made and extended transversely bluntly and sharply. The fascia was separated  from the underlying rectus tissue superiorly and inferiorly sharply. The rectus muscles were separated in the midline bluntly and the peritoneum was  entered bluntly. An Alexis retractor was placed to aid in visualization of the uterus. The utero-vesical peritoneal reflection was incised transversely and the bladder flap was bluntly freed from the lower uterine segment. A low transverse uterine incision was made. The infant was successfully delivered from cephalic presentation, the umbilical cord was clamped after 1 minute. Cord ph was not sent, and cord blood was obtained for evaluation. The placenta was removed Intact and appeared normal with meconium staining. The uterine incision was closed with a single layer running locked suture of 0-Vicryl. Several additional sutures placed for hemostasis.  Attention was then turned to the fallopian tubes. The left Fallopian tube was identified and then traced to it's fimbriae. Using the Ligasure device and taking care to avoid large vascular structures, the left fallopian tube was removed sequentially from the fimbriae to the cornua, with excellent hemostasis noted. Attention was then turned to the right fallopian tube, and after confirmation of identification by tracing the tube out to the fimbriae, the same procedure was then performed on with excellent hemostasis noted.  At this time, Dr. Charlotta Newton was called for an emergent delivery. Dr. Tenny Craw then came in and continued the surgery. The abdomen and pelvis were cleared of all clot and debris and the Jon Gills was removed. Hemostasis was confirmed on all surfaces.  Peritoneum, muscle with significant scar tissue were closed. The fascia was then closed using 0 Vicryl in a running fashion. The subcutaneous layer was not reapproximated. The skin was closed with a 4-0 vicryl subcuticular stitch. The patient tolerated the procedure well. Sponge, lap, instrument and needle counts were correct x 2. She was taken to the recovery room  in stable condition.  Disposition: PACU - hemodynamically stable.    Signed: Joanne Gavel, MD OB Fellow, [facility] for [facility], [facility]

## 2023-11-24 NOTE — Lactation Note (Signed)
This note was copied from a baby's chart. Lactation Consultation Note  Patient Name: Melissa Delacruz [id number] Date: 11/24/2023 Age:39 hours Reason for consult: Initial assessment;Term;RN request;Breastfeeding assistance  P3- MOB reports that infant has not been nursing or eating the bottle very well due to coordination issues. MOB reports that she wants infant to latch to the breast, but she is fine is infant is also supplementing with formula. LC offered to assist with latching infant to the breast since it had been 2 hrs since the last bottle. MOB agreed and consented. LC placed infant on the right breast in the cross cradle hold. MOB had LC hold infant's head to attempt the latch for the entire attempt. LC was able to get infant to latch a few times, and she suckled once. Infant would not actually suck on the breast at this time. MOB was excited that infant at least latched and stated that this was an improvement. Breastfeeding attempt lasted 5 minutes total.  MOB wanted LC to bottle feed formula to infant to assess sucking abilities. LC used a white Nfant nipple and placed infant in a side-lying pace feeding position. Entire bottle feeding lasted 25 minutes. LC was able to feed infant 18 mL total. Infant's largest volume before this was 10 mL. Infant was tongue thrusting most of the time, not creating a suction some of the time and gagging some of the time. LC informed MOB and RN of this assessment.   LC offered to set up a DEBP for when infant does not latch for stimulation. MOB declined and stated that she has her own DEBP to use. LC encouraged MOB to call for further latching assistance, but she stated that she will not try to latch again tonight. She wants to wait until [facility] comes back tomorrow to latch again. LC reviewed CDC milk storage guidelines, LC services handout, feeding infant on cue 8-12x in 24 hrs, and not allowing infant to go over 3 hrs without a feeding.   Maternal Data Has  patient been taught Hand Expression?: No Does the patient have breastfeeding experience prior to this delivery?: Yes How long did the patient breastfeed?: 6 months with first and 3 months with second  Feeding Mother's Current Feeding Choice: Breast Milk and Formula Nipple Type: Nfant Standard Flow (white)  LATCH Score Latch: Repeated attempts needed to sustain latch, nipple held in mouth throughout feeding, stimulation needed to elicit sucking reflex.  Audible Swallowing: None  Type of Nipple: Everted at rest and after stimulation  Comfort (Breast/Nipple): Soft / non-tender  Hold (Positioning): Full assist, staff holds infant at breast  LATCH Score: 5   Lactation Tools Discussed/Used Pump Education: Milk Storage  Interventions Interventions: Breast feeding basics reviewed;Assisted with latch;Breast compression;Adjust position;Support pillows;Position options;Expressed milk;Education;Pace feeding;LC Services brochure  Discharge Discharge Education: Warning signs for feeding baby Pump: DEBP;Personal  Consult Status Consult Status: Follow-up Date: 11/25/23 Follow-up type: In-patient    Dema Severin BS, IBCLC 11/24/2023, 5:26 PM

## 2023-11-24 NOTE — Progress Notes (Signed)
LABOR PROGRESS NOTE  Patient Name: Melissa Delacruz, female   DOB: 1986/05/11, 37 y.o.  MRN: [id number]  [id number] at [redacted]w[redacted]d admitted for SOL/TOLAC  S: Feeling more rectal pressure  O:  BP 103/62   Pulse (!) 115   Temp 99 F (37.2 C) (Axillary)   Resp 18   LMP 02/10/2023   SpO2 100%  EFM:155 bpm/Moderate variability/ 15x15 accels/ Variable decels CAT: 2 Toco: regular, every 2 minutes   CVE: Dilation: 8.5 Effacement (%): 90 Cervical Position: Anterior Station: -2 Presentation: Vertex Exam by:: Earlene Plater, MD   A&P:   #Labor: Some progression since last exam. Discussed risks/benefits of IUPC placement, and verbal consent obtained. Placed without difficulty. Mom and babe tolerated well. Continue to titrate pit #Pain: Epidural #FWB: CAT 2 #GBS negative #Anticipate vaginal delivery  Joanne Gavel, MD FMOB Fellow, Faculty practice [facility], [facility] 11/24/23  1:16 AM

## 2023-11-24 NOTE — Lactation Note (Signed)
This note was copied from a baby's chart. Lactation Consultation Note  Patient Name: Melissa Delacruz [id number] Date: 11/24/2023 Age:39 hours Reason for consult: Mother's request;Term Mom called for latch assistance. Mom stated baby finally took some formula she wanted to try get baby on the breast. LC placed baby in football hold. Baby had no interest in BF at this time. Attempted to give more formula. Baby has un-coordinated suck swallow. Baby done more chewing on nipple not suckling. Explained sometimes takes baby's 24 hrs before they get good and hungry. Try w/cues or at least every 3 hrs. Mom wanted hand pump. Gave mom hand pump. She could pump some colostrum out. Praised mom. Milk storage reviewed.  Maternal Data Has patient been taught Hand Expression?: Yes  Feeding Nipple Type: Nfant Standard Flow (white)  LATCH Score Latch: Too sleepy or reluctant, no latch achieved, no sucking elicited.  Audible Swallowing: None  Type of Nipple: Everted at rest and after stimulation (short shaft)  Comfort (Breast/Nipple): Filling, red/small blisters or bruises, mild/mod discomfort (areola edema to Lt. breast)  Hold (Positioning): Full assist, staff holds infant at breast  LATCH Score: 3   Lactation Tools Discussed/Used Tools: Pump;Flanges Flange Size: 18 Breast pump type: Manual Pump Education: Setup, frequency, and cleaning;Milk Storage Reason for Pumping: moms request Pumping frequency: prn  Interventions Interventions: Breast feeding basics reviewed;Assisted with latch;Breast massage;Hand express;Pre-pump if needed;Breast compression;Reverse pressure;Adjust position;Support pillows;Position options;Expressed milk;Hand pump;Education  Discharge    Consult Status Consult Status: Follow-up Date: 11/25/23 Follow-up type: In-patient    Charyl Dancer 11/24/2023, 9:15 PM

## 2023-11-25 DIAGNOSIS — D62 Acute posthemorrhagic anemia: Secondary | ICD-10-CM | POA: Insufficient documentation

## 2023-11-25 LAB — CBC
HCT: 21.5 % — ABNORMAL LOW (ref 36.0–46.0)
Hemoglobin: 7 g/dL — ABNORMAL LOW (ref 12.0–15.0)
MCH: 25 pg — ABNORMAL LOW (ref 26.0–34.0)
MCHC: 32.6 g/dL (ref 30.0–36.0)
MCV: 76.8 fL — ABNORMAL LOW (ref 80.0–100.0)
Platelets: 202 10*3/uL (ref 150–400)
RBC: 2.8 MIL/uL — ABNORMAL LOW (ref 3.87–5.11)
RDW: 17.9 % — ABNORMAL HIGH (ref 11.5–15.5)
WBC: 14.6 10*3/uL — ABNORMAL HIGH (ref 4.0–10.5)
nRBC: 0.1 % (ref 0.0–0.2)

## 2023-11-25 MED ORDER — SODIUM CHLORIDE 0.9 % IV SOLN
500.0000 mg | Freq: Once | INTRAVENOUS | Status: AC
  Administered 2023-11-25: 500 mg via INTRAVENOUS
  Filled 2023-11-25: qty 25

## 2023-11-25 MED ORDER — IBUPROFEN 100 MG/5ML PO SUSP
800.0000 mg | Freq: Three times a day (TID) | ORAL | Status: DC
  Administered 2023-11-25 – 2023-11-26 (×4): 800 mg via ORAL
  Filled 2023-11-25 (×4): qty 40

## 2023-11-25 MED ORDER — CHILDRENS CHEW MULTIVITAMIN PO CHEW
1.0000 | CHEWABLE_TABLET | Freq: Every day | ORAL | Status: DC
  Filled 2023-11-25 (×2): qty 1

## 2023-11-25 MED ORDER — SENNOSIDES 8.8 MG/5ML PO SYRP
10.0000 mL | ORAL_SOLUTION | Freq: Every day | ORAL | Status: DC
  Administered 2023-11-25: 10 mL via ORAL
  Filled 2023-11-25 (×2): qty 10
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
## 2023-11-26 ENCOUNTER — Other Ambulatory Visit: Payer: Self-pay

## 2023-11-26 ENCOUNTER — Inpatient Hospital Stay (HOSPITAL_COMMUNITY)
Admission: RE | Admit: 2023-11-26 | Payer: No Typology Code available for payment source | Source: Home / Self Care | Admitting: Obstetrics & Gynecology

## 2023-11-26 MED ORDER — OXYCODONE HCL 5 MG PO TABS: 5.0000 mg | ORAL_TABLET | ORAL | 0 refills | Status: AC | PRN

## 2023-11-26 MED ORDER — ACETAMINOPHEN 325 MG PO TABS: 650.0000 mg | ORAL_TABLET | ORAL | 0 refills | Status: AC | PRN

## 2023-11-26 NOTE — Plan of Care (Signed)
  Problem: Education: Goal: Knowledge of Childbirth will improve Outcome: Progressing Goal: Ability to make informed decisions regarding treatment and plan of care will improve Outcome: Progressing Goal: Ability to state and carry out methods to decrease the pain will improve Outcome: Progressing   Problem: Coping: Goal: Ability to verbalize concerns and feelings about labor and delivery will improve Outcome: Progressing   Problem: Life Cycle: Goal: Ability to make normal progression through stages of labor will improve Outcome: Progressing Goal: Ability to effectively push during vaginal delivery will improve Outcome: Progressing

## 2023-11-27 LAB — SURGICAL PATHOLOGY

## 2023-12-02 ENCOUNTER — Telehealth (HOSPITAL_COMMUNITY): Payer: Self-pay | Admitting: *Deleted

## 2023-12-02 NOTE — Telephone Encounter (Signed)
 Attempted hospital discharge follow-up call. Left message for patient to return RN call with any questions or concerns. Deforest Hoyles, RN, 12/02/23, [phone]

## 2023-12-04 ENCOUNTER — Ambulatory Visit: Payer: No Typology Code available for payment source

## 2023-12-26 ENCOUNTER — Ambulatory Visit: Payer: No Typology Code available for payment source | Admitting: Family Medicine

## 2025-01-02 ENCOUNTER — Other Ambulatory Visit: Payer: Self-pay

## 2025-01-02 ENCOUNTER — Emergency Department (HOSPITAL_COMMUNITY)

## 2025-01-02 ENCOUNTER — Emergency Department (HOSPITAL_COMMUNITY)
Admission: EM | Admit: 2025-01-02 | Discharge: 2025-01-02 | Disposition: A | Discharge status: Home / Self Care | Source: Home / Self Care | Attending: Emergency Medicine | Admitting: Emergency Medicine

## 2025-01-02 ENCOUNTER — Encounter (HOSPITAL_COMMUNITY): Payer: Self-pay | Admitting: *Deleted

## 2025-01-02 DIAGNOSIS — S0083XA Contusion of other part of head, initial encounter: Secondary | ICD-10-CM

## 2025-01-02 DIAGNOSIS — M25561 Pain in right knee: Secondary | ICD-10-CM

## 2025-01-02 MED ORDER — IBUPROFEN 600 MG PO TABS: 600.0000 mg | ORAL_TABLET | Freq: Four times a day (QID) | ORAL | 0 refills | Status: AC | PRN

## 2025-01-02 MED ORDER — IBUPROFEN 400 MG PO TABS
600.0000 mg | ORAL_TABLET | Freq: Once | ORAL | Status: DC
  Filled 2025-01-02: qty 1

## 2025-01-02 MED ORDER — METHOCARBAMOL 500 MG PO TABS: 500.0000 mg | ORAL_TABLET | Freq: Two times a day (BID) | ORAL | 0 refills | Status: AC

## 2025-01-02 MED ORDER — IBUPROFEN 100 MG/5ML PO SUSP
600.0000 mg | Freq: Once | ORAL | Status: DC
  Filled 2025-01-02: qty 30
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
## 2025-01-02 NOTE — Discharge Instructions (Signed)
The pain you are experiencing is likely due to muscle strain, you may take Ibuprofen and Robaxin as needed for pain management. Do not combine with any pain reliever other than tylenol.  You may also use ice and heat, and over-the-counter remedies such as Biofreeze gel or salon pas lidocaine patches. The muscle soreness should improve over the next week. Follow up with your family doctor in the next week for a recheck if you are still having symptoms. Return to ED if pain is worsening, you develop weakness or numbness of extremities, or new or concerning symptoms develop.  

## 2025-01-02 NOTE — ED Notes (Signed)
 Waiting on suspension to be sent from main pharmacy.
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
## 2025-01-02 NOTE — ED Notes (Signed)
 Patient provided with ice pack for knee.
